# Patient Record
Sex: Female | Born: 1990 | Race: White | Hispanic: No | Marital: Single | Smoking: Current every day smoker
Health system: Southern US, Community
[De-identification: ages and names within clinical notes are randomized; demographics above are authoritative.]

## PROBLEM LIST (undated history)

## (undated) ENCOUNTER — Inpatient Hospital Stay (HOSPITAL_COMMUNITY): Payer: Self-pay

## (undated) DIAGNOSIS — Z789 Other specified health status: Secondary | ICD-10-CM

## (undated) DIAGNOSIS — F32A Depression, unspecified: Secondary | ICD-10-CM

## (undated) DIAGNOSIS — F909 Attention-deficit hyperactivity disorder, unspecified type: Secondary | ICD-10-CM

## (undated) HISTORY — DX: Depression, unspecified: F32.A

## (undated) HISTORY — DX: Attention-deficit hyperactivity disorder, unspecified type: F90.9

## (undated) HISTORY — DX: Other specified health status: Z78.9

## (undated) HISTORY — PX: NO PAST SURGERIES: SHX2092

---

## 2016-12-13 ENCOUNTER — Encounter: Payer: Self-pay | Admitting: Family Medicine

## 2016-12-13 ENCOUNTER — Ambulatory Visit (INDEPENDENT_AMBULATORY_CARE_PROVIDER_SITE_OTHER): Payer: Self-pay

## 2016-12-13 DIAGNOSIS — Z3201 Encounter for pregnancy test, result positive: Secondary | ICD-10-CM

## 2016-12-13 LAB — POCT PREGNANCY, URINE: PREG TEST UR: POSITIVE — AB

## 2016-12-13 NOTE — Progress Notes (Signed)
Patient presented to the office for a UPT. UPT positive. Patient is 5w 5d. Advised patient to start prenatal vitamins and prenatal care.

## 2016-12-18 ENCOUNTER — Encounter (HOSPITAL_COMMUNITY): Payer: Self-pay | Admitting: *Deleted

## 2016-12-18 ENCOUNTER — Inpatient Hospital Stay (HOSPITAL_COMMUNITY): Payer: Self-pay

## 2016-12-18 ENCOUNTER — Inpatient Hospital Stay (HOSPITAL_COMMUNITY)
Admission: AD | Admit: 2016-12-18 | Discharge: 2016-12-18 | Disposition: A | Discharge status: Home / Self Care | Payer: Self-pay | Source: Ambulatory Visit | Attending: Family Medicine | Admitting: Family Medicine

## 2016-12-18 DIAGNOSIS — N760 Acute vaginitis: Secondary | ICD-10-CM | POA: Insufficient documentation

## 2016-12-18 DIAGNOSIS — B9689 Other specified bacterial agents as the cause of diseases classified elsewhere: Secondary | ICD-10-CM | POA: Insufficient documentation

## 2016-12-18 DIAGNOSIS — Z3A01 Less than 8 weeks gestation of pregnancy: Secondary | ICD-10-CM | POA: Insufficient documentation

## 2016-12-18 DIAGNOSIS — O209 Hemorrhage in early pregnancy, unspecified: Secondary | ICD-10-CM

## 2016-12-18 DIAGNOSIS — O200 Threatened abortion: Secondary | ICD-10-CM | POA: Insufficient documentation

## 2016-12-18 LAB — URINALYSIS, ROUTINE W REFLEX MICROSCOPIC
Bilirubin Urine: NEGATIVE
Glucose, UA: NEGATIVE mg/dL
Hgb urine dipstick: NEGATIVE
KETONES UR: NEGATIVE mg/dL
LEUKOCYTES UA: NEGATIVE
NITRITE: NEGATIVE
Protein, ur: NEGATIVE mg/dL
Specific Gravity, Urine: 1.021 (ref 1.005–1.030)
pH: 5 (ref 5.0–8.0)

## 2016-12-18 LAB — WET PREP, GENITAL
Sperm: NONE SEEN
Trich, Wet Prep: NONE SEEN
Yeast Wet Prep HPF POC: NONE SEEN

## 2016-12-18 LAB — HCG, QUANTITATIVE, PREGNANCY: HCG, BETA CHAIN, QUANT, S: 45800 m[IU]/mL — AB (ref <5)

## 2016-12-18 LAB — ABO/RH: ABO/RH(D): O POS

## 2016-12-18 NOTE — MAU Provider Note (Signed)
Chief Complaint: Vaginal Bleeding   SUBJECTIVE HPI: Rebecca Weber is a 26 y.o. G2P0010 at [redacted]w[redacted]d who presents to MAU for vaginal bleeding. Patient states that bleeding first occurred Thursday after working at her job. 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 Bleeding started after that. She describes the bleeding as light pink and scant. Has continued to have the slight bleeding since then. She is requesting a note for light duties at work. She has not had initial prenatal care yet. She had a urine pregnancy test that was confirmed positive for pregnancy. She is dated by LMP. She denies any abdominal pain, vaginal discharge, fever, dysuria.   No past medical history on file. OB History  No data available   No past surgical history on file. Social History   Social History  . Marital status: Single    Spouse name: N/A  . Number of children: N/A  . Years of education: N/A   Occupational History  . Not on file.   Social History Main Topics  . Smoking status: Not on file  . Smokeless tobacco: Not on file  . Alcohol use Not on file  . Drug use: Unknown  . Sexual activity: Not on file   Other Topics Concern  . Not on file   Social History Narrative  . No narrative on file   No current facility-administered medications on file prior to encounter.    No current outpatient prescriptions on file prior to encounter.   Allergies  Allergen Reactions  . Sulfa Antibiotics Rash    I have reviewed the past Medical Hx, Surgical Hx, Social Hx, Allergies and Medications.   REVIEW OF SYSTEMS All systems reviewed and are negative for acute change except as noted in the HPI.   OBJECTIVE BP (!) 106/59   Pulse 65   Temp 98.4 F (36.9 C) (Oral)   Ht  (1.676 m)   Wt 79.4 kg (175 lb)   SpO2 100%   Breastfeeding? Unknown   BMI 28.25 kg/m   PHYSICAL EXAM Constitutional: Well-developed, well-nourished female in no acute distress.  Cardiovascular: normal rate  and rhythm, pulses intact Respiratory: normal rate and effort.  GI: Abd soft, non-tender, non-distended. No guarding or rebound tenderness.  MS: Extremities nontender, no edema, normal ROM Neurologic: Alert and oriented x 4. No focal deficits SPECULUM EXAM: NEFG, physiologic discharge, faint pink blood noted, cervix clean and closed BIMANUAL: No adnexal tenderness or masses. No CMT.  LAB RESULTS Results for orders placed or performed during the hospital encounter of 12/18/16 (from the past 24 hour(s))  Urinalysis, Routine w reflex microscopic     Status: Abnormal   Collection Time: 12/18/16  9:30 AM  Result Value Ref Range   Color, Urine YELLOW YELLOW   APPearance HAZY (A) CLEAR   Specific Gravity, Urine 1.021 1.005 - 1.030   pH 5.0 5.0 - 8.0   Glucose, UA NEGATIVE NEGATIVE mg/dL   Hgb urine dipstick NEGATIVE NEGATIVE   Bilirubin Urine NEGATIVE NEGATIVE   Ketones, ur NEGATIVE NEGATIVE mg/dL   Protein, ur NEGATIVE NEGATIVE mg/dL   Nitrite NEGATIVE NEGATIVE   Leukocytes, UA NEGATIVE NEGATIVE  Wet prep, genital     Status: Abnormal   Collection Time: 12/18/16 10:00 AM  Result Value Ref Range   Yeast Wet Prep HPF POC NONE SEEN NONE SEEN   Trich, Wet Prep NONE SEEN NONE SEEN   Clue Cells Wet Prep HPF POC PRESENT (A) NONE SEEN  WBC, Wet Prep HPF POC FEW (A) NONE SEEN   Sperm NONE SEEN   hCG, quantitative, pregnancy     Status: Abnormal   Collection Time: 12/18/16 10:16 AM  Result Value Ref Range   hCG, Beta Chain, Quant, S 45,800 (H) <5 mIU/mL  ABO/Rh     Status: None   Collection Time: 12/18/16 10:16 AM  Result Value Ref Range   ABO/RH(D) O POS     IMAGING [place] Ob Comp Less 14 Wks  Result Date: 12/18/2016 CLINICAL DATA:  26 year old pregnant female presents with 2 days of vaginal spotting. EDC by LMP: 08/10/2017, projecting to an expected gestational age of [redacted] weeks 3 days. EXAM: OBSTETRIC <14 WK US AND TRANSVAGINAL OB US TECHNIQUE: Both transabdominal and transvaginal  ultrasound examinations were performed for complete evaluation of the gestation as well as the maternal uterus, adnexal regions, and pelvic cul-de-sac. Transvaginal technique was performed to assess early pregnancy. COMPARISON:  None. FINDINGS: Intrauterine gestational sac: Single intrauterine gestational sac appears normal in size, shape and position. Yolk sac:  Visualized. Embryo:  Visualized. Embryonic Cardiac Activity: Visualized. Embryonic Heart Rate: 115  bpm CRL:  6.3  mm   6 w   3 d                  US EDC: 08/10/2017 Subchorionic hemorrhage:  None visualized. Maternal uterus/adnexae: Anteverted uterus. No uterine fibroids. No abnormal free fluid in the pelvis. Right ovary measures 3.4 x 1.7 x 1.8 cm and contains a corpus luteum. Left ovary measures 2.9 x 1.8 x 1.7 cm. No abnormal ovarian or adnexal masses. IMPRESSION: 1. Single living intrauterine gestation at 6 weeks 3 days by crown-rump length, concordant with provided menstrual dating. Embryonic heart rate 115 bpm, probably normal for this early gestational age. No acute early first-trimester gestational abnormality. 2. No ovarian or adnexal abnormality. Electronically Signed   By: Delbert PhenixJason A Poff M.D.   On: 12/18/2016 11:15   [place] Ob Transvaginal  Result Date: 12/18/2016 CLINICAL DATA:  26 year old pregnant female presents with 2 days of vaginal spotting. EDC by LMP: 08/10/2017, projecting to an expected gestational age of [redacted] weeks 3 days. EXAM: OBSTETRIC <14 WK US AND TRANSVAGINAL OB US TECHNIQUE: Both transabdominal and transvaginal ultrasound examinations were performed for complete evaluation of the gestation as well as the maternal uterus, adnexal regions, and pelvic cul-de-sac. Transvaginal technique was performed to assess early pregnancy. COMPARISON:  None. FINDINGS: Intrauterine gestational sac: Single intrauterine gestational sac appears normal in size, shape and position. Yolk sac:  Visualized. Embryo:  Visualized. Embryonic Cardiac Activity:  Visualized. Embryonic Heart Rate: 115  bpm CRL:  6.3  mm   6 w   3 d                  US EDC: 08/10/2017 Subchorionic hemorrhage:  None visualized. Maternal uterus/adnexae: Anteverted uterus. No uterine fibroids. No abnormal free fluid in the pelvis. Right ovary measures 3.4 x 1.7 x 1.8 cm and contains a corpus luteum. Left ovary measures 2.9 x 1.8 x 1.7 cm. No abnormal ovarian or adnexal masses. IMPRESSION: 1. Single living intrauterine gestation at 6 weeks 3 days by crown-rump length, concordant with provided menstrual dating. Embryonic heart rate 115 bpm, probably normal for this early gestational age. No acute early first-trimester gestational abnormality. 2. No ovarian or adnexal abnormality. Electronically Signed   By: Delbert PhenixJason A Poff M.D.   On: 12/18/2016 11:15    MAU COURSE Vitals and nursing notes reviewed I have ordered  labs/imaging and reviewed them bHCG collected and above discriminatory zone TV US showed viable intrauterine pregnancy Does have BV on wet prep but patient asymptomatic Cultures collected and pending  MDM Plan of care reviewed with patient, including labs and tests ordered and medical treatment.   ASSESSMENT 1. Vaginal bleeding in pregnancy, first trimester   2. Threatened miscarriage   3. Bacterial vaginosis     PLAN Discharge home in stable condition Miscarriage and bleeding precautions given Will hold off on treating BV since not symptomatic and first trimester Follow-up with OB provider Letter for work given Handout given   Caryl Ada, DO [facility], [facility] 12/18/2016, 9:48 AM

## 2016-12-18 NOTE — Discharge Instructions (Signed)
Vaginal Bleeding During Pregnancy, First Trimester °A small amount of bleeding (spotting) from the vagina is common in early pregnancy. Sometimes the bleeding is normal and is not a problem, and sometimes it is a sign of something serious. Be sure to tell your doctor about any bleeding from your vagina right away. °Follow these instructions at home: °· Watch your condition for any changes. °· Follow your doctor's instructions about how active you can be. °· If you are on bed rest: °? You may need to stay in bed and only get up to use the bathroom. °? You may be allowed to do some activities. °? If you need help, make plans for someone to help you. °· Write down: °? The number of pads you use each day. °? How often you change pads. °? How soaked (saturated) your pads are. °· Do not use tampons. °· Do not douche. °· Do not have sex or orgasms until your doctor says it is okay. °· If you pass any tissue from your vagina, save the tissue so you can show it to your doctor. °· Only take medicines as told by your doctor. °· Do not take aspirin because it can make you bleed. °· Keep all follow-up visits as told by your doctor. °Contact a doctor if: °· You bleed from your vagina. °· You have cramps. °· You have labor pains. °· You have a fever that does not go away after you take medicine. °Get help right away if: °· You have very bad cramps in your back or belly (abdomen). °· You pass large clots or tissue from your vagina. °· You bleed more. °· You feel light-headed or weak. °· You pass out (faint). °· You have chills. °· You are leaking fluid or have a gush of fluid from your vagina. °· You pass out while pooping (having a bowel movement). °This information is not intended to replace advice given to you by your health care provider. Make sure you discuss any questions you have with your health care provider. °Document Released: 08/10/2013 Document Revised: 09/01/2015 Document Reviewed: 12/01/2012 °Elsevier Interactive  Patient Education © 2018 [employer] Inc. ° °

## 2016-12-18 NOTE — MAU Note (Signed)
Pt. was at work on Sunday, pt. Informed employer that she could not do any heavy lifting, but due to no documentation from doctor...they would not honor patient's request.  Pt. Started vaginal bleeding shortly after lifting heavy items.  Today, pain 1/10..."stomach ache". Small amount of bleeding.

## 2016-12-19 LAB — GC/CHLAMYDIA PROBE AMP (~~LOC~~) NOT AT ARMC
Chlamydia: NEGATIVE
NEISSERIA GONORRHEA: NEGATIVE

## 2017-02-01 ENCOUNTER — Ambulatory Visit (INDEPENDENT_AMBULATORY_CARE_PROVIDER_SITE_OTHER): Payer: Self-pay | Admitting: Family Medicine

## 2017-02-01 ENCOUNTER — Encounter: Payer: Self-pay | Admitting: Family Medicine

## 2017-02-01 DIAGNOSIS — Z124 Encounter for screening for malignant neoplasm of cervix: Secondary | ICD-10-CM

## 2017-02-01 DIAGNOSIS — Z3490 Encounter for supervision of normal pregnancy, unspecified, unspecified trimester: Secondary | ICD-10-CM | POA: Insufficient documentation

## 2017-02-01 DIAGNOSIS — O99330 Smoking (tobacco) complicating pregnancy, unspecified trimester: Secondary | ICD-10-CM | POA: Insufficient documentation

## 2017-02-01 DIAGNOSIS — Z3491 Encounter for supervision of normal pregnancy, unspecified, first trimester: Secondary | ICD-10-CM

## 2017-02-01 DIAGNOSIS — Z3481 Encounter for supervision of other normal pregnancy, first trimester: Secondary | ICD-10-CM

## 2017-02-01 DIAGNOSIS — Z23 Encounter for immunization: Secondary | ICD-10-CM

## 2017-02-01 DIAGNOSIS — O99331 Smoking (tobacco) complicating pregnancy, first trimester: Secondary | ICD-10-CM

## 2017-02-01 LAB — POCT URINALYSIS DIP (DEVICE)
BILIRUBIN URINE: NEGATIVE
Glucose, UA: NEGATIVE mg/dL
HGB URINE DIPSTICK: NEGATIVE
Ketones, ur: NEGATIVE mg/dL
LEUKOCYTES UA: NEGATIVE
NITRITE: NEGATIVE
Protein, ur: NEGATIVE mg/dL
SPECIFIC GRAVITY, URINE: 1.015 (ref 1.005–1.030)
UROBILINOGEN UA: 0.2 mg/dL (ref 0.0–1.0)
pH: 7.5 (ref 5.0–8.0)

## 2017-02-01 NOTE — Patient Instructions (Signed)
Breastfeeding Deciding to breastfeed is one of the best choices you can make for you and your baby. A change in hormones during pregnancy causes your breast tissue to grow and increases the number and size of your milk ducts. These hormones also allow proteins, sugars, and fats from your blood supply to make breast milk in your milk-producing glands. Hormones prevent breast milk from being released before your baby is born as well as prompt milk flow after birth. Once breastfeeding has begun, thoughts of your baby, as well as his or her sucking or crying, can stimulate the release of milk from your milk-producing glands. Benefits of breastfeeding For Your Baby  Your first milk (colostrum) helps your baby's digestive system function better.  There are antibodies in your milk that help your baby fight off infections.  Your baby has a lower incidence of asthma, allergies, and sudden infant death syndrome.  The nutrients in breast milk are better for your baby than infant formulas and are designed uniquely for your baby's needs.  Breast milk improves your baby's brain development.  Your baby is less likely to develop other conditions, such as childhood obesity, asthma, or type 2 diabetes mellitus.  For You  Breastfeeding helps to create a very special bond between you and your baby.  Breastfeeding is convenient. Breast milk is always available at the correct temperature and costs nothing.  Breastfeeding helps to burn calories and helps you lose the weight gained during pregnancy.  Breastfeeding makes your uterus contract to its prepregnancy size faster and slows bleeding (lochia) after you give birth.  Breastfeeding helps to lower your risk of developing type 2 diabetes mellitus, osteoporosis, and breast or ovarian cancer later in life.  Signs that your baby is hungry Early Signs of Hunger  Increased alertness or activity.  Stretching.  Movement of the head from side to  side.  Movement of the head and opening of the mouth when the corner of the mouth or cheek is stroked (rooting).  Increased sucking sounds, smacking lips, cooing, sighing, or squeaking.  Hand-to-mouth movements.  Increased sucking of fingers or hands.  Late Signs of Hunger  Fussing.  Intermittent crying.  Extreme Signs of Hunger Signs of extreme hunger will require calming and consoling before your baby will be able to breastfeed successfully. Do not wait for the following signs of extreme hunger to occur before you initiate breastfeeding:  Restlessness.  A loud, strong cry.  Screaming.  Breastfeeding basics Breastfeeding Initiation  Find a comfortable place to sit or lie down, with your neck and back well supported.  Place a pillow or rolled up blanket under your baby to bring him or her to the level of your breast (if you are seated). Nursing pillows are specially designed to help support your arms and your baby while you breastfeed.  Make sure that your baby's abdomen is facing your abdomen.  Gently massage your breast. With your fingertips, massage from your chest wall toward your nipple in a circular motion. This encourages milk flow. You may need to continue this action during the feeding if your milk flows slowly.  Support your breast with 4 fingers underneath and your thumb above your nipple. Make sure your fingers are well away from your nipple and your baby's mouth.  Stroke your baby's lips gently with your finger or nipple.  When your baby's mouth is open wide enough, quickly bring your baby to your breast, placing your entire nipple and as much of the colored area   around your nipple (areola) as possible into your baby's mouth. ? More areola should be visible above your baby's upper lip than below the lower lip. ? Your baby's tongue should be between his or her lower gum and your breast.  Ensure that your baby's mouth is correctly positioned around your nipple  (latched). Your baby's lips should create a seal on your breast and be turned out (everted).  It is common for your baby to suck about 2-3 minutes in order to start the flow of breast milk.  Latching Teaching your baby how to latch on to your breast properly is very important. An improper latch can cause nipple pain and decreased milk supply for you and poor weight gain in your baby. Also, if your baby is not latched onto your nipple properly, he or she may swallow some air during feeding. This can make your baby fussy. Burping your baby when you switch breasts during the feeding can help to get rid of the air. However, teaching your baby to latch on properly is still the best way to prevent fussiness from swallowing air while breastfeeding. Signs that your baby has successfully latched on to your nipple:  Silent tugging or silent sucking, without causing you pain.  Swallowing heard between every 3-4 sucks.  Muscle movement above and in front of his or her ears while sucking.  Signs that your baby has not successfully latched on to nipple:  Sucking sounds or smacking sounds from your baby while breastfeeding.  Nipple pain.  If you think your baby has not latched on correctly, slip your finger into the corner of your baby's mouth to break the suction and place it between your baby's gums. Attempt breastfeeding initiation again. Signs of Successful Breastfeeding Signs from your baby:  A gradual decrease in the number of sucks or complete cessation of sucking.  Falling asleep.  Relaxation of his or her body.  Retention of a small amount of milk in his or her mouth.  Letting go of your breast by himself or herself.  Signs from you:  Breasts that have increased in firmness, weight, and size 1-3 hours after feeding.  Breasts that are softer immediately after breastfeeding.  Increased milk volume, as well as a change in milk consistency and color by the fifth day of  breastfeeding.  Nipples that are not sore, cracked, or bleeding.  Signs That Your Baby is Getting Enough Milk  Wetting at least 1-2 diapers during the first 24 hours after birth.  Wetting at least 5-6 diapers every 24 hours for the first week after birth. The urine should be clear or pale yellow by 5 days after birth.  Wetting 6-8 diapers every 24 hours as your baby continues to grow and develop.  At least 3 stools in a 24-hour period by age 5 days. The stool should be soft and yellow.  At least 3 stools in a 24-hour period by age 7 days. The stool should be seedy and yellow.  No loss of weight greater than 10% of birth weight during the first 3 days of age.  Average weight gain of 4-7 ounces (113-198 g) per week after age 4 days.  Consistent daily weight gain by age 5 days, without weight loss after the age of 2 weeks.  After a feeding, your baby may spit up a small amount. This is common. Breastfeeding frequency and duration Frequent feeding will help you make more milk and can prevent sore nipples and breast engorgement. Breastfeed when   you feel the need to reduce the fullness of your breasts or when your baby shows signs of hunger. This is called "breastfeeding on demand." Avoid introducing a pacifier to your baby while you are working to establish breastfeeding (the first 4-6 weeks after your baby is born). After this time you may choose to use a pacifier. Research has shown that pacifier use during the first year of a baby's life decreases the risk of sudden infant death syndrome (SIDS). Allow your baby to feed on each breast as long as he or she wants. Breastfeed until your baby is finished feeding. When your baby unlatches or falls asleep while feeding from the first breast, offer the second breast. Because newborns are often sleepy in the first few weeks of life, you may need to awaken your baby to get him or her to feed. Breastfeeding times will vary from baby to baby. However,  the following rules can serve as a guide to help you ensure that your baby is properly fed:  Newborns (babies 4 weeks of age or younger) may breastfeed every 1-3 hours.  Newborns should not go longer than 3 hours during the day or 5 hours during the night without breastfeeding.  You should breastfeed your baby a minimum of 8 times in a 24-hour period until you begin to introduce solid foods to your baby at around 6 months of age.  Breast milk pumping Pumping and storing breast milk allows you to ensure that your baby is exclusively fed your breast milk, even at times when you are unable to breastfeed. This is especially important if you are going back to work while you are still breastfeeding or when you are not able to be present during feedings. Your lactation consultant can give you guidelines on how long it is safe to store breast milk. A breast pump is a machine that allows you to pump milk from your breast into a sterile bottle. The pumped breast milk can then be stored in a refrigerator or freezer. Some breast pumps are operated by hand, while others use electricity. Ask your lactation consultant which type will work best for you. Breast pumps can be purchased, but some hospitals and breastfeeding support groups lease breast pumps on a monthly basis. A lactation consultant can teach you how to hand express breast milk, if you prefer not to use a pump. Caring for your breasts while you breastfeed Nipples can become dry, cracked, and sore while breastfeeding. The following recommendations can help keep your breasts moisturized and healthy:  Avoid using soap on your nipples.  Wear a supportive bra. Although not required, special nursing bras and tank tops are designed to allow access to your breasts for breastfeeding without taking off your entire bra or top. Avoid wearing underwire-style bras or extremely tight bras.  Air dry your nipples for 3-4minutes after each feeding.  Use only cotton  bra pads to absorb leaked breast milk. Leaking of breast milk between feedings is normal.  Use lanolin on your nipples after breastfeeding. Lanolin helps to maintain your skin's normal moisture barrier. If you use pure lanolin, you do not need to wash it off before feeding your baby again. Pure lanolin is not toxic to your baby. You may also hand express a few drops of breast milk and gently massage that milk into your nipples and allow the milk to air dry.  In the first few weeks after giving birth, some women experience extremely full breasts (engorgement). Engorgement can make your   breasts feel heavy, warm, and tender to the touch. Engorgement peaks within 3-5 days after you give birth. The following recommendations can help ease engorgement:  Completely empty your breasts while breastfeeding or pumping. You may want to start by applying warm, moist heat (in the shower or with warm water-soaked hand towels) just before feeding or pumping. This increases circulation and helps the milk flow. If your baby does not completely empty your breasts while breastfeeding, pump any extra milk after he or she is finished.  Wear a snug bra (nursing or regular) or tank top for 1-2 days to signal your body to slightly decrease milk production.  Apply ice packs to your breasts, unless this is too uncomfortable for you.  Make sure that your baby is latched on and positioned properly while breastfeeding.  If engorgement persists after 48 hours of following these recommendations, contact your health care provider or a lactation consultant. Overall health care recommendations while breastfeeding  Eat healthy foods. Alternate between meals and snacks, eating 3 of each per day. Because what you eat affects your breast milk, some of the foods may make your baby more irritable than usual. Avoid eating these foods if you are sure that they are negatively affecting your baby.  Drink milk, fruit juice, and water to  satisfy your thirst (about 10 glasses a day).  Rest often, relax, and continue to take your prenatal vitamins to prevent fatigue, stress, and anemia.  Continue breast self-awareness checks.  Avoid chewing and smoking tobacco. Chemicals from cigarettes that pass into breast milk and exposure to secondhand smoke may harm your baby.  Avoid alcohol and drug use, including marijuana. Some medicines that may be harmful to your baby can pass through breast milk. It is important to ask your health care provider before taking any medicine, including all over-the-counter and prescription medicine as well as vitamin and herbal supplements. It is possible to become pregnant while breastfeeding. If birth control is desired, ask your health care provider about options that will be safe for your baby. Contact a health care provider if:  You feel like you want to stop breastfeeding or have become frustrated with breastfeeding.  You have painful breasts or nipples.  Your nipples are cracked or bleeding.  Your breasts are red, tender, or warm.  You have a swollen area on either breast.  You have a fever or chills.  You have nausea or vomiting.  You have drainage other than breast milk from your nipples.  Your breasts do not become full before feedings by the fifth day after you give birth.  You feel sad and depressed.  Your baby is too sleepy to eat well.  Your baby is having trouble sleeping.  Your baby is wetting less than 3 diapers in a 24-hour period.  Your baby has less than 3 stools in a 24-hour period.  Your baby's skin or the white part of his or her eyes becomes yellow.  Your baby is not gaining weight by 5 days of age. Get help right away if:  Your baby is overly tired (lethargic) and does not want to wake up and feed.  Your baby develops an unexplained fever. This information is not intended to replace advice given to you by your health care provider. Make sure you discuss  any questions you have with your health care provider. Document Released: 03/26/2005 Document Revised: 09/07/2015 Document Reviewed: 09/17/2012 Elsevier Interactive Patient Education  2017 [employer]  

## 2017-02-01 NOTE — Progress Notes (Signed)
    Subjective:    Rebecca Weber is a G2P0010 8154w4d being seen today for her first obstetrical visit.  Her obstetrical history is significant for smoker. Patient does intend to breast feed. Pregnancy history fully reviewed.  Patient reports no complaints.  Vitals:   02/01/17 1023  BP: 113/67  Pulse: 64  Weight: 160 lb (72.6 kg)    HISTORY: OB History  Gravida Para Term Preterm AB Living  2 0 0 0 1 0  SAB TAB Ectopic Multiple Live Births  0 1 0 0 0    # Outcome Date GA Lbr Len/2nd Weight Sex Delivery Anes PTL Lv  2 Current           1 TAB 10/2012             Past Medical History:  Diagnosis Date  . Medical history non-contributory    Past Surgical History:  Procedure Laterality Date  . NO PAST SURGERIES     Family History  Problem Relation Age of Onset  . Diabetes Mother      Exam    Uterus:   12 wk size  Pelvic Exam:    Perineum: Normal Perineum   Vulva: normal   Vagina:  normal mucosa, normal discharge   Cervix: no lesions and nulliparous appearance   Adnexa: normal adnexa   Bony Pelvis: average  System: Breast:  normal appearance, no masses or tenderness   Skin: normal coloration and turgor, no rashes   Neurologic: oriented   Extremities: no erythema, induration, or nodules   HEENT extra ocular movement intact and sclera clear, anicteric   Mouth/Teeth mucous membranes moist, pharynx normal without lesions   Neck supple and no masses   Cardiovascular: regular rate and rhythm, no murmurs or gallops   Respiratory:  appears well, vitals normal, no respiratory distress, acyanotic, normal RR, ear and throat exam is normal, neck free of mass or lymphadenopathy, chest clear, no wheezing, crepitations, rhonchi, normal symmetric air entry   Abdomen: soft, non-tender; bowel sounds normal; no masses,  no organomegaly      Assessment/Plan:    Pregnancy: G2P0010 1. Encounter for supervision of low-risk pregnancy in first trimester New OB labs History and meds  reviewed Babyscripts optimization done - Obstetric Panel, Including HIV - Culture, OB Urine - Cystic fibrosis gene test - Cytology - PAP - Flu Vaccine QUAD 36+ mos IM (Fluarix, Quad PF) - US MFM Fetal Nuchal Translucency; Future - Babyscripts Schedule Optimization  2. Tobacco smoking affecting pregnancy in first trimester Anatomy u/Rebecca scheduled - US MFM OB DETAIL +14 WK; Future - HgB A1c   Rebecca Weber Rebecca Weber 02/01/2017

## 2017-02-03 LAB — OBSTETRIC PANEL, INCLUDING HIV
ANTIBODY SCREEN: NEGATIVE
BASOS ABS: 0 10*3/uL (ref 0.0–0.2)
BASOS: 0 %
EOS (ABSOLUTE): 0.3 10*3/uL (ref 0.0–0.4)
Eos: 4 %
HEMATOCRIT: 38 % (ref 34.0–46.6)
HIV SCREEN 4TH GENERATION: NONREACTIVE
Hemoglobin: 12.6 g/dL (ref 11.1–15.9)
Hepatitis B Surface Ag: NEGATIVE
IMMATURE GRANS (ABS): 0 10*3/uL (ref 0.0–0.1)
Immature Granulocytes: 0 %
LYMPHS: 23 %
Lymphocytes Absolute: 1.8 10*3/uL (ref 0.7–3.1)
MCH: 32.4 pg (ref 26.6–33.0)
MCHC: 33.2 g/dL (ref 31.5–35.7)
MCV: 98 fL — ABNORMAL HIGH (ref 79–97)
MONOS ABS: 0.4 10*3/uL (ref 0.1–0.9)
Monocytes: 5 %
Neutrophils Absolute: 5.2 10*3/uL (ref 1.4–7.0)
Neutrophils: 68 %
PLATELETS: 225 10*3/uL (ref 150–379)
RBC: 3.89 x10E6/uL (ref 3.77–5.28)
RDW: 13.3 % (ref 12.3–15.4)
RPR Ser Ql: NONREACTIVE
Rh Factor: POSITIVE
Rubella Antibodies, IGG: 1.12 index (ref >0.99)
WBC: 7.6 10*3/uL (ref 3.4–10.8)

## 2017-02-04 ENCOUNTER — Ambulatory Visit (HOSPITAL_COMMUNITY): Admission: RE | Admit: 2017-02-04 | Payer: Self-pay | Source: Ambulatory Visit

## 2017-02-04 ENCOUNTER — Other Ambulatory Visit: Payer: Self-pay | Admitting: Family Medicine

## 2017-02-04 ENCOUNTER — Encounter (HOSPITAL_COMMUNITY): Payer: Self-pay

## 2017-02-04 ENCOUNTER — Ambulatory Visit (HOSPITAL_COMMUNITY)
Admission: RE | Admit: 2017-02-04 | Discharge: 2017-02-04 | Disposition: A | Discharge status: Home / Self Care | Payer: Self-pay | Source: Ambulatory Visit | Attending: Family Medicine | Admitting: Family Medicine

## 2017-02-04 DIAGNOSIS — O99331 Smoking (tobacco) complicating pregnancy, first trimester: Secondary | ICD-10-CM | POA: Insufficient documentation

## 2017-02-04 DIAGNOSIS — Z3682 Encounter for antenatal screening for nuchal translucency: Secondary | ICD-10-CM | POA: Insufficient documentation

## 2017-02-04 DIAGNOSIS — Z3A13 13 weeks gestation of pregnancy: Secondary | ICD-10-CM | POA: Insufficient documentation

## 2017-02-04 DIAGNOSIS — Z3491 Encounter for supervision of normal pregnancy, unspecified, first trimester: Secondary | ICD-10-CM

## 2017-02-04 LAB — CYTOLOGY - PAP
Adequacy: ABSENT
DIAGNOSIS: NEGATIVE

## 2017-02-06 LAB — URINE CULTURE, OB REFLEX

## 2017-02-06 LAB — CULTURE, OB URINE

## 2017-02-08 LAB — CYSTIC FIBROSIS GENE TEST

## 2017-02-11 ENCOUNTER — Encounter: Payer: Self-pay | Admitting: *Deleted

## 2017-03-19 ENCOUNTER — Encounter: Payer: Self-pay | Admitting: Advanced Practice Midwife

## 2017-03-19 ENCOUNTER — Encounter: Payer: Self-pay | Admitting: General Practice

## 2017-03-19 ENCOUNTER — Other Ambulatory Visit: Payer: Self-pay | Admitting: Family Medicine

## 2017-03-19 ENCOUNTER — Ambulatory Visit (HOSPITAL_COMMUNITY)
Admission: RE | Admit: 2017-03-19 | Discharge: 2017-03-19 | Disposition: A | Discharge status: Home / Self Care | Payer: Medicaid Other | Source: Ambulatory Visit | Attending: Family Medicine | Admitting: Family Medicine

## 2017-03-19 ENCOUNTER — Ambulatory Visit (INDEPENDENT_AMBULATORY_CARE_PROVIDER_SITE_OTHER): Payer: Self-pay | Admitting: Advanced Practice Midwife

## 2017-03-19 ENCOUNTER — Other Ambulatory Visit: Payer: Self-pay | Admitting: Certified Nurse Midwife

## 2017-03-19 VITALS — BP 135/72 | HR 102 | Wt 170.1 lb

## 2017-03-19 DIAGNOSIS — O99332 Smoking (tobacco) complicating pregnancy, second trimester: Secondary | ICD-10-CM | POA: Diagnosis not present

## 2017-03-19 DIAGNOSIS — Z1371 Encounter for nonprocreative screening for genetic disease carrier status: Secondary | ICD-10-CM

## 2017-03-19 DIAGNOSIS — Z3689 Encounter for other specified antenatal screening: Secondary | ICD-10-CM | POA: Insufficient documentation

## 2017-03-19 DIAGNOSIS — Z3A19 19 weeks gestation of pregnancy: Secondary | ICD-10-CM | POA: Diagnosis not present

## 2017-03-19 DIAGNOSIS — Z3492 Encounter for supervision of normal pregnancy, unspecified, second trimester: Secondary | ICD-10-CM

## 2017-03-19 DIAGNOSIS — O99331 Smoking (tobacco) complicating pregnancy, first trimester: Secondary | ICD-10-CM

## 2017-03-19 NOTE — Progress Notes (Signed)
Pt wants quad screen

## 2017-03-19 NOTE — Progress Notes (Signed)
   PRENATAL VISIT NOTE  Subjective:  Rebecca Weber is a 26 y.o. G2P0010 at 212w1d being seen today for ongoing prenatal care.  She is currently monitored for the following issues for this low-risk pregnancy and has Supervision of low-risk pregnancy and Tobacco smoking affecting pregnancy on their problem list.  Patient reports no complaints.  Contractions: Not present. Vag. Bleeding: None.  Movement: Present. Denies leaking of fluid.   The following portions of the patient's history were reviewed and updated as appropriate: allergies, current medications, past family history, past medical history, past social history, past surgical history and problem list. Problem list updated.  Objective:   Vitals:   03/19/17 1022  BP: 135/72  Pulse: (!) 102  Weight: 170 lb 1.6 oz (77.2 kg)    Fetal Status: Fetal Heart Rate (bpm): 154   Movement: Present     General:  Alert, oriented and cooperative. Patient is in no acute distress.  Skin: Skin is warm and dry. No rash noted.   Cardiovascular: Normal heart rate noted  Respiratory: Normal respiratory effort, no problems with respiration noted  Abdomen: Soft, gravid, appropriate for gestational age.  Pain/Pressure: Absent     Pelvic: Cervical exam deferred        Extremities: Normal range of motion.  Edema: None  Mental Status:  Normal mood and affect. Normal behavior. Normal judgment and thought content.   Assessment and Plan:  Pregnancy: G2P0010 at 642w1d  1. Encounter for supervision of low-risk pregnancy in second trimester -Discussed use of BabyScripts app as scheduled and inputing BP weekly or need to be seen in office more recently.   2. Tobacco smoking affecting pregnancy in second trimester -Anatomy US scheduled for today   3. Screening for genetic disease carrier status - AFP TETRA  Preterm labor symptoms and general obstetric precautions including but not limited to vaginal bleeding, contractions, leaking of fluid and fetal movement  were reviewed in detail with the patient. Please refer to After Visit Summary for other counseling recommendations.  Return in about 8 weeks (around 05/14/2017) for ROB.   Sharyon CableVeronica C Reshma Hoey, CNM

## 2017-03-19 NOTE — Patient Instructions (Signed)
Childbirth Education Options: [facility] Classes:  Childbirth education classes can help you get ready for a positive parenting experience. You can also meet other expectant parents and get free stuff for your baby. Each class runs for five weeks on the same night and costs $45 for the mother-to-be and her support person. Medicaid covers the cost if you are eligible. Call [phone] to register. [facility] Childbirth Education:  [phone] or [phone] or [email or web address]  Baby & Me Class: Discuss newborn & infant parenting and family adjustment issues with other new mothers in a relaxed environment. Each week brings a new speaker or baby-centered activity. We encourage new mothers to join us every Thursday at 11:00am. Babies birth until crawling. No registration or fee. Daddy Boot Camp: This course offers Dads-to-be the tools and knowledge needed to feel confident on their journey to becoming new fathers. Experienced dads, who have been trained as coaches, teach dads-to-be how to hold, comfort, diaper, swaddle and play with their infant while being able to support the new mom as well. A class for men taught by men. $25/dad Big Brother/Big Sister: Let your children share in the joy of a new brother or sister in this special class designed just for them. Class includes discussion about how families care for babies: swaddling, holding, diapering, safety as well as how they can be helpful in their new role. This class is designed for children ages 2 to 6, but any age is welcome. Please register each child individually. $5/child  Mom Talk: This mom-led group offers support and connection to mothers as they journey through the adjustments and struggles of that sometimes overwhelming first year after the birth of a child. Tuesdays at 10:00am and Thursdays at 6:00pm. Babies welcome. No registration or fee. Breastfeeding Support Group: This group is a mother-to-mother  support circle where moms have the opportunity to share their breastfeeding experiences. A Lactation Consultant is present for questions and concerns. Meets each Tuesday at 11:00am. No fee or registration. Breastfeeding Your Baby: Learn what to expect in the first days of breastfeeding your newborn.  This class will help you feel more confident with the skills needed to begin your breastfeeding experience. Many new mothers are concerned about breastfeeding after leaving the hospital. This class will also address the most common fears and challenges about breastfeeding during the first few weeks, months and beyond. (call for fee) Comfort Techniques and Tour: This 2 hour interactive class will provide you the opportunity to learn & practice hands-on techniques that can help relieve some of the discomfort of labor and encourage your baby to rotate toward the best position for birth. You and your partner will be able to try a variety of labor positions with birth balls and rebozos as well as practice breathing, relaxation, and visualization techniques. A tour of the [facility] is included with this class. $20 per registrant and support person Childbirth Class- Weekend Option: This class is a Weekend version of our Birth & Baby series. It is designed for parents who have a difficult time fitting several weeks of classes into their schedule. It covers the care of your newborn and the basics of labor and childbirth. It also includes a Maternity Care Center Tour of [facility] and lunch. The class is held two consecutive days: beginning on Friday evening from 6:30 - 8:30 p.m. and the next day, Saturday from 9 a.m. - 4 p.m. (call for fee) Waterbirth Class: Interested in a waterbirth?  This   informational class will help you discover whether waterbirth is the right fit for you. Education about waterbirth itself, supplies you would need and how to assemble your support team is what you can  expect from this class. Some obstetrical practices require this class in order to pursue a waterbirth. (Not all obstetrical practices offer waterbirth-check with your healthcare provider.) Register only the expectant mom, but you are encouraged to bring your partner to class! Required if planning waterbirth, no fee. Infant/Child CPR: Parents, grandparents, babysitters, and friends learn Cardio-Pulmonary Resuscitation skills for infants and children. You will also learn how to treat both conscious and unconscious choking in infants and children. This Family & Friends program does not offer certification. Register each participant individually to ensure that enough mannequins are available. (Call for fee) Grandparent Love: Expecting a grandbaby? This class is for you! Learn about the latest infant care and safety recommendations and ways to support your own child as he or she transitions into the parenting role. Taught by Registered Nurses who are childbirth instructors, but most importantly...they are grandmothers too! $10/person. Childbirth Class- Natural Childbirth: This series of 5 weekly classes is for expectant parents who want to learn and practice natural methods of coping with the process of labor and childbirth. Relaxation, breathing, massage, visualization, role of the partner, and helpful positioning are highlighted. Participants learn how to be confident in their body's ability to give birth. This class will empower and help parents make informed decisions about their own care. Includes discussion that will help new parents transition into the immediate postpartum period. Maternity Care Center Tour of [facility] is included. We suggest taking this class between 25-32 weeks, but it's only a recommendation. $75 per registrant and one support person or $30 Medicaid. Childbirth Class- 3 week Series: This option of 3 weekly classes helps you and your labor partner prepare for childbirth. Newborn  care, labor & birth, cesarean birth, pain management, and comfort techniques are discussed and a Maternity Care Center Tour of [facility] is included. The class meets at the same time, on the same day of the week for 3 consecutive weeks beginning with the starting date you choose. $60 for registrant and one support person.  Marvelous Multiples: Expecting twins, triplets, or more? This class covers the differences in labor, birth, parenting, and breastfeeding issues that face multiples' parents. NICU tour is included. Led by a Certified Childbirth Educator who is the mother of twins. No fee. Caring for Baby: This class is for expectant and adoptive parents who want to learn and practice the most up-to-date newborn care for their babies. Focus is on birth through the first six weeks of life. Topics include feeding, bathing, diapering, crying, umbilical cord care, circumcision care and safe sleep. Parents learn to recognize symptoms of illness and when to call the pediatrician. Register only the mom-to-be and your partner or support person can plan to come with you! $10 per registrant and support person Childbirth Class- online option: This online class offers you the freedom to complete a Birth and Baby series in the comfort of your own home. The flexibility of this option allows you to review sections at your own pace, at times convenient to you and your support people. It includes additional video information, animations, quizzes, and extended activities. Get organized with helpful eClass tools, checklists, and trackers. Once you register online for the class, you will receive an email within a few days to accept the invitation and begin the class when the time   is right for you. The content will be available to you for 60 days. $60 for 60 days of online access for you and your support people.  Local Doulas: Natural Baby Doulas [email or web address] Tel:  [phone] [email or web address] [facility] [phone] [email or web address] The Labor Ladies  (also do waterbirth tub rental) [phone] [email or web address] [facility] [phone] [email or web address] Sacred Rhythms  [phone] [email or web address] [place] [employer] (PADA) [email or web address] http://www.padanc.org/index.htm [place] Birth and Baby  [email or web address] Considering Waterbirth? Guide for patients at [facility]  Why consider waterbirth?  . Gentle birth for babies . Less pain medicine used in labor . May allow for passive descent/less pushing . May reduce perineal tears  . More mobility and instinctive maternal position changes . Increased maternal relaxation . Reduced blood pressure in labor  Is waterbirth safe? What are the risks of infection, drowning or other complications?  . Infection: o Very low risk (3.7 % for tub vs 4.8% for bed) o 7 in 8000 waterbirths with documented infection o Poorly cleaned equipment most common cause o Slightly lower group B strep transmission rate  . Drowning o Maternal:  - Very low risk   - Related to seizures or fainting o Newborn:  - Very low risk. No evidence of increased risk of respiratory problems in multiple large studies - Physiological protection from breathing under water - Avoid underwater birth if there are any fetal complications - Once baby's head is out of the water, keep it out.  . Birth complication o Some reports of cord trauma, but risk decreased by bringing baby to surface gradually o No evidence of increased risk of shoulder dystocia. Mothers can usually change positions faster in water than in a bed, possibly aiding the maneuvers to free the shoulder.   You must attend a Waterbirth class at [facility]  3rd Wednesday of every month from 7-9pm  Free  Register by calling [phone] or online at [email or web address]  Bring us the certificate from the class to your prenatal appointment  Meet with a midwife at 36 weeks to see if you can still plan a waterbirth and to sign the consent.   Purchase or rent the following supplies:   Water Birth Pool (Birth Pool in a Box or [place] for instance)  (Tubs start ~$125)  Single-use disposable tub liner designed for your brand of tub  New garden hose labeled "lead-free", "suitable for drinking water",  Electric drain pump to remove water (We recommend 792 gallon per hour or greater pump.)   Separate garden hose to remove the dirty water  Fish net  Bathing suit top (optional)  Long-handled mirror (optional)  Places to purchase or rent supplies  Yourwaterbirth.com for tub purchases and supplies  Waterbirthsolutions.com for tub purchases and supplies  The Labor Ladies (www.[email or web address]) $275 for tub rental/set-up & take down/kit   [facility] [employer] (http://www.padanc.org/MeetUs.htm) Information regarding doulas (labor support) who provide pool rentals  Our practice has a Birth Pool in a Box tub at the hospital that you may borrow on a first-come-first-served basis. It is your responsibility to to set up, clean and break down the tub. We cannot guarantee the availability of this tub in advance. You are responsible for bringing all accessories listed above. If you do not have all necessary supplies you cannot have a waterbirth.    Things that would prevent you from having a waterbirth:  Premature, <37wks  Previous cesarean birth  Presence of thick meconium-stained fluid  Multiple gestation (Twins,   triplets, etc.)  Uncontrolled diabetes or gestational diabetes requiring medication  Hypertension requiring medication or diagnosis of pre-eclampsia  Heavy vaginal bleeding  Non-reassuring fetal  heart rate  Active infection (MRSA, etc.). Group B Strep is NOT a contraindication for  waterbirth.  If your labor has to be induced and induction method requires continuous  monitoring of the baby's heart rate  Other risks/issues identified by your obstetrical provider  Please remember that birth is unpredictable. Under certain unforeseeable circumstances your provider may advise against giving birth in the tub. These decisions will be made on a case-by-case basis and with the safety of you and your baby as our highest priority.   AREA PEDIATRIC/FAMILY PRACTICE PHYSICIANS  [facility] [address], [address] [place], [place]  [address] Phone - [phone]   Fax - [phone]  [facility] [address] [place], [place] [address] Phone - [phone]   Fax - [phone]  [facility] [address] [place], [place]  [address] Phone - [phone]   Fax - [phone]  [facility] [address] [place], [place]  [address] Phone - [phone]   Fax - [phone]  [facility] [address] [place], [place]  [address] Phone - [phone]   Fax - [phone]  [facility] [address] [place], [place]  [address] Phone - [phone]   Fax - [phone]  [facility] [address] [place], [place]  [address] Phone - [phone]   Fax - [phone]  [facility] [address] [place], [place]  [address] Phone - [phone]   Fax - [phone]  [facility] [address] [place], [place]  [address] Phone - [phone]   Fax - [phone] [facility] [address] [place], [place]  [address] Phone - [phone]   Fax - [phone]  [facility] [address] [place], [place]  [address] Phone -  [phone]   Fax - [phone]  [facility] [address] [place], [place]  [address] Phone - [phone]   Fax - [phone]  [facility] [address] [place], [place]  [address] Phone - [phone]   Fax - [phone]  [facility] [address] [place], [place]  [address] Phone - [phone]  [facility] [address] [place], [place]  [address] Phone - [phone]   Fax - [phone]  [facility] [address] [place], [place]  [address] Phone - [phone]   Fax - [phone]  [facility] [address] [place], [place]  [address] Phone - [phone]   Fax - [phone]  [facility] [address] [place], [place]  [address] Phone - [phone]   Fax - [phone]   [facility] [address] [place], [place]  [address] Phone - [phone]   Fax - [phone]  [facility] [address] [place],   [address] Phone - [phone]   Fax - [phone]  [facility] [address] ,   [address] Phone - [phone]   Fax - [phone]  [facility] [address] ,   [address] Phone - [phone]   Fax - [phone]  [facility] [address] [place], [place]  [address] Phone - [phone]   Fax - [phone] [facility] [address] [place], [place]  [address] Phone - [phone]   Fax - [phone]  [facility] [address] [place], [place]  [address] Phone - [phone]   Fax - [phone]  [facility] [address] [place], [place]  [address] Phone - [phone]   Fax - [phone]  [facility] Charlene Flemming MD [address] [place] [address] Phone [phone]  Fax [phone]     

## 2017-03-20 ENCOUNTER — Encounter: Payer: Self-pay | Admitting: Medical

## 2017-03-22 LAB — AFP TETRA
DIA Mom Value: 2.13
DIA Value (EIA): 356.25 pg/mL
DSR (BY AGE) 1 IN: 878
DSR (Second Trimester) 1 IN: 2548
GESTATIONAL AGE AFP: 19.1 wk
MSAFP MOM: 1.11
MSAFP: 50.2 ng/mL
MSHCG MOM: 0.65
MSHCG: 14620 m[IU]/mL
Maternal Age At EDD: 27.7 yr
OSB RISK: 8647
T18 (By Age): 1:3420 {titer}
Test Results:: NEGATIVE
UE3 MOM: 1.25
UE3 VALUE: 1.93 ng/mL
Weight: 170 [lb_av]

## 2017-03-27 ENCOUNTER — Encounter: Payer: Self-pay | Admitting: General Practice

## 2017-04-15 ENCOUNTER — Encounter: Payer: Self-pay | Admitting: General Practice

## 2017-04-15 NOTE — Progress Notes (Unsigned)
Patient was removed from baby scripts due to non compliance, has appt on 1/10

## 2017-04-18 ENCOUNTER — Encounter: Payer: Self-pay | Admitting: Certified Nurse Midwife

## 2017-04-19 ENCOUNTER — Telehealth: Payer: Self-pay | Admitting: General Practice

## 2017-04-19 NOTE — Telephone Encounter (Signed)
Patient missed appointment on 04/18/17.  Called patient to reschedule appointment.  Left message on VM for patient to give our office a call.  Patient is called scheduled for 04/30/17 at 9:15am.

## 2017-04-24 ENCOUNTER — Ambulatory Visit (HOSPITAL_COMMUNITY): Payer: Self-pay | Attending: Family Medicine

## 2017-04-30 ENCOUNTER — Encounter: Payer: Self-pay | Admitting: Student

## 2017-05-20 ENCOUNTER — Telehealth: Payer: Self-pay | Admitting: Obstetrics & Gynecology

## 2017-05-20 NOTE — Telephone Encounter (Signed)
Called patient because she does not have an appointment scheduled. No answer so I left a message for her to call us back ASAP.

## 2017-06-10 ENCOUNTER — Encounter: Payer: Self-pay | Admitting: Advanced Practice Midwife

## 2017-11-04 ENCOUNTER — Encounter (HOSPITAL_COMMUNITY): Payer: Self-pay

## 2018-12-06 IMAGING — US US OB COMP LESS 14 WK
1 series · 15 of 28 positions shown · non-contrast
Comparison: None.

CLINICAL DATA: 26-year-old pregnant female presents with 2 days of
vaginal spotting.

EDC by LMP: 08/10/2017, projecting to an expected gestational age of
6 weeks 3 days.
EXAM:
OBSTETRIC <14 WK US AND TRANSVAGINAL OB US
TECHNIQUE: Both transabdominal and transvaginal ultrasound examinations were
performed for complete evaluation of the gestation as well as the
maternal uterus, adnexal regions, and pelvic cul-de-sac.
Transvaginal technique was performed to assess early pregnancy.

[Series 1: us ob comp less 14 wk · 15 of 41 slices shown]
[im 1/41]
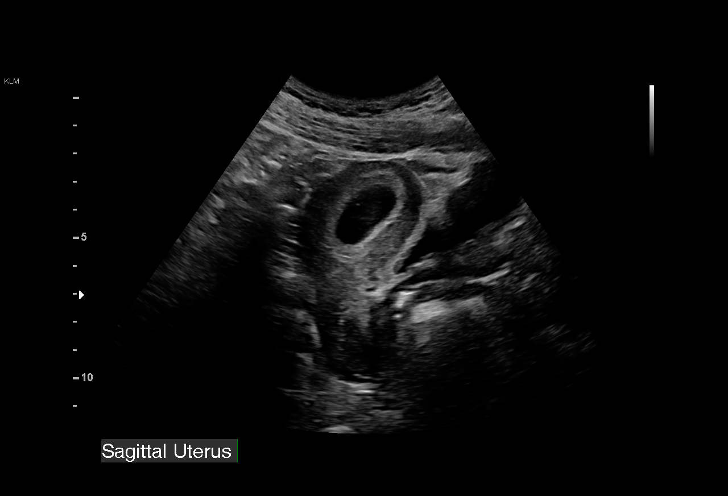
[im 3/41]
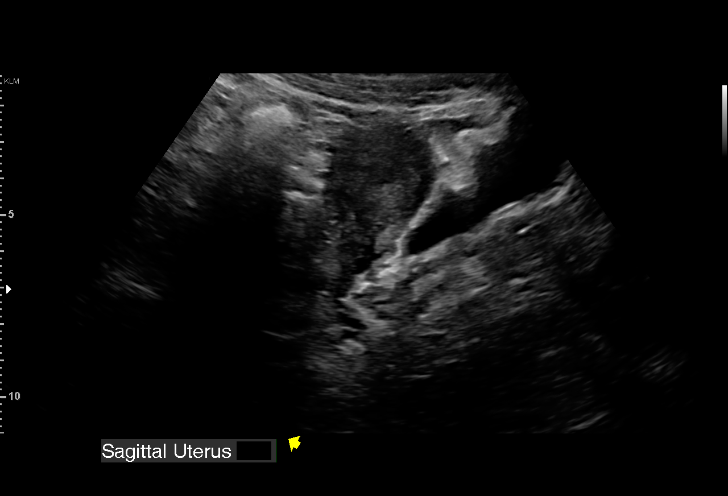
[im 6/41]
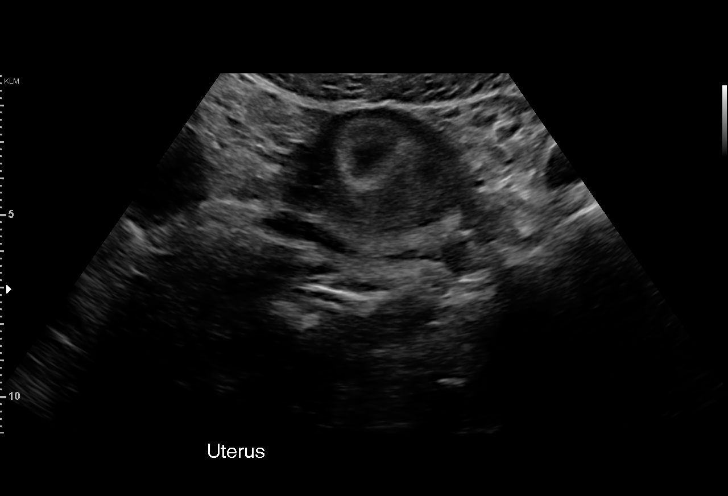
[im 9/41]
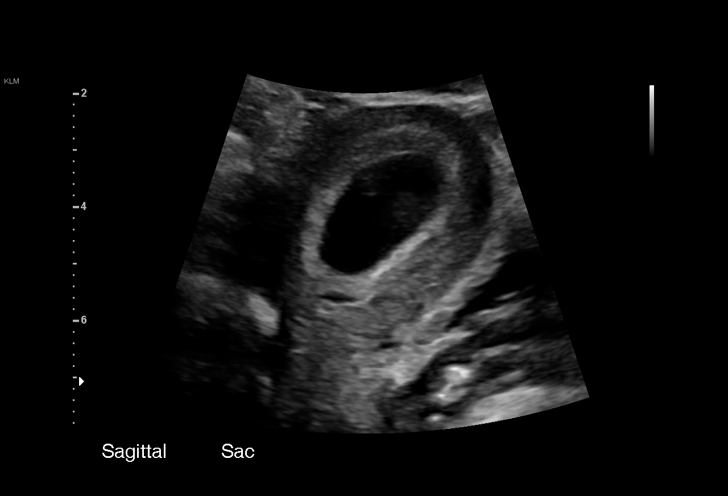
[im 12/41]
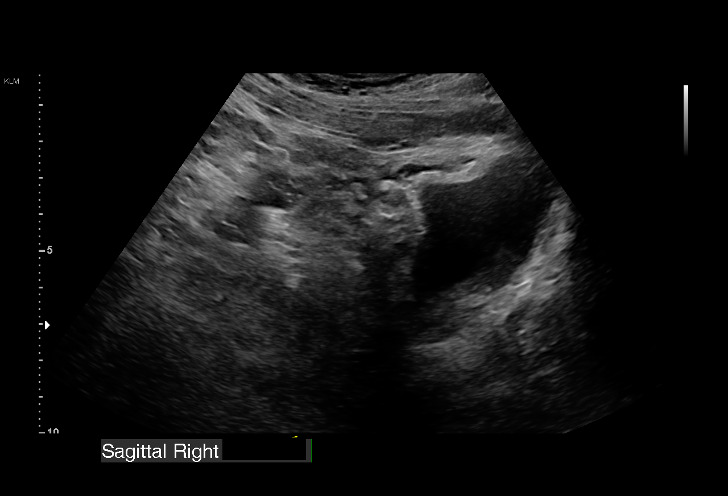
[im 15/41]
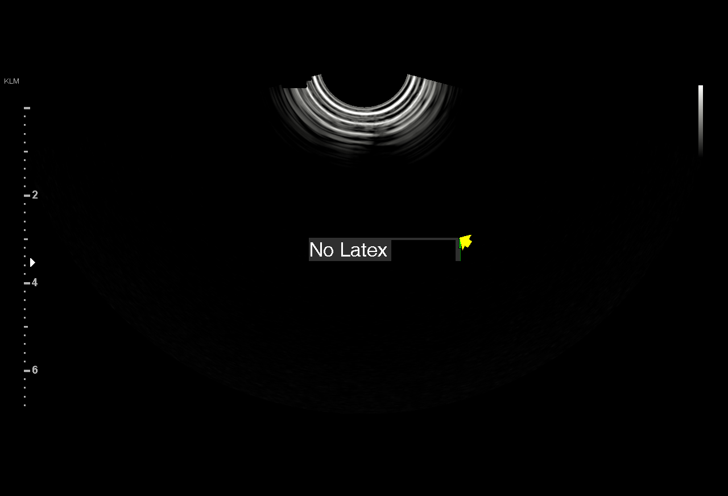
[im 18/41]
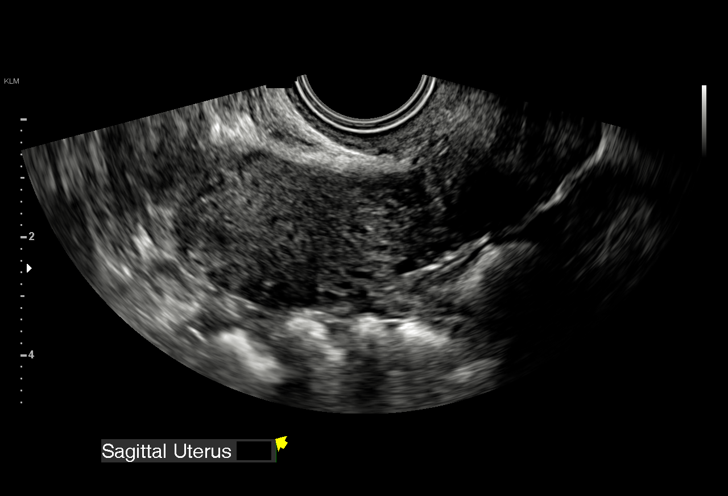
[im 21/41]
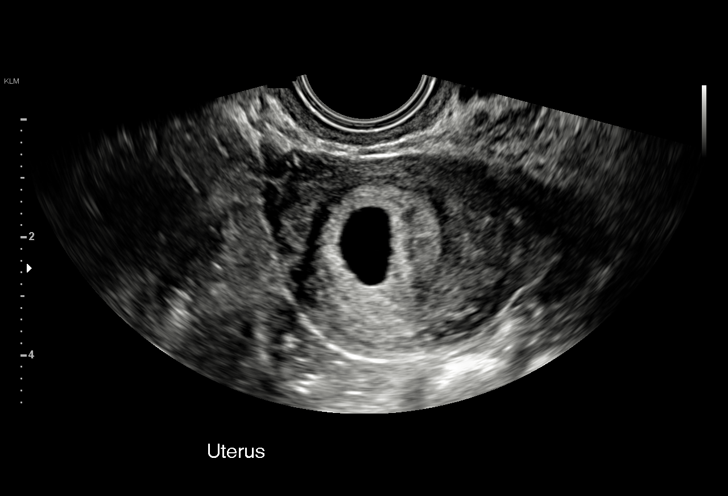
[im 23/41]
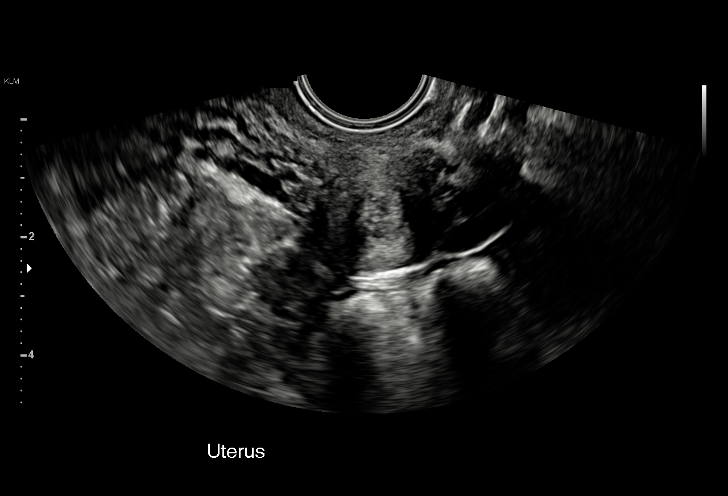
[im 26/41]
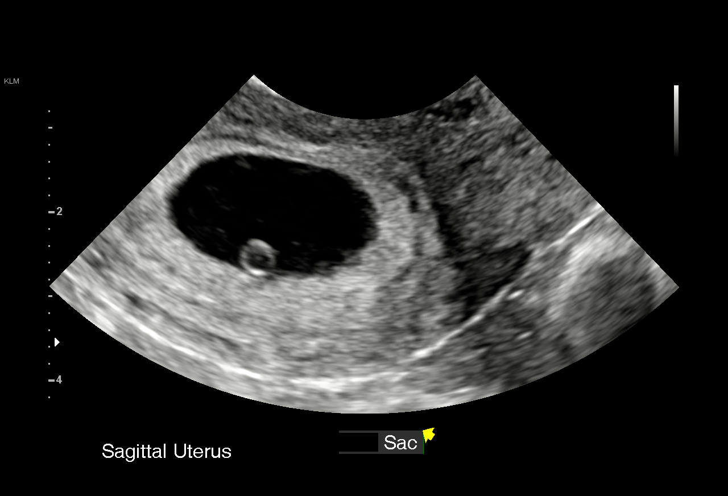
[im 29/41]
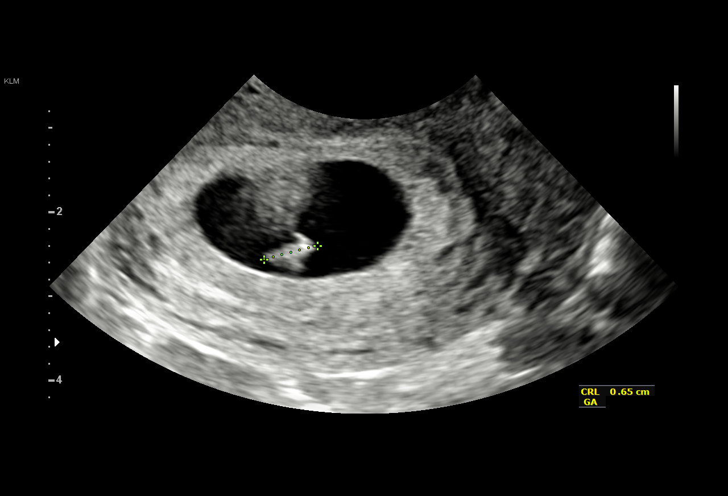
[im 32/41]
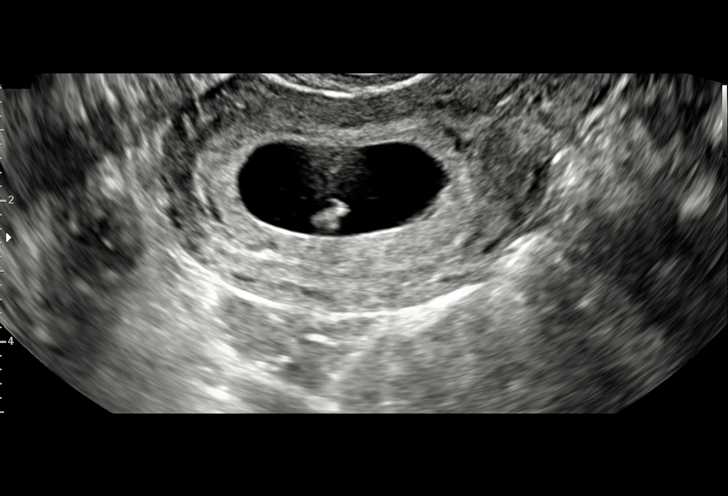
[im 35/41]
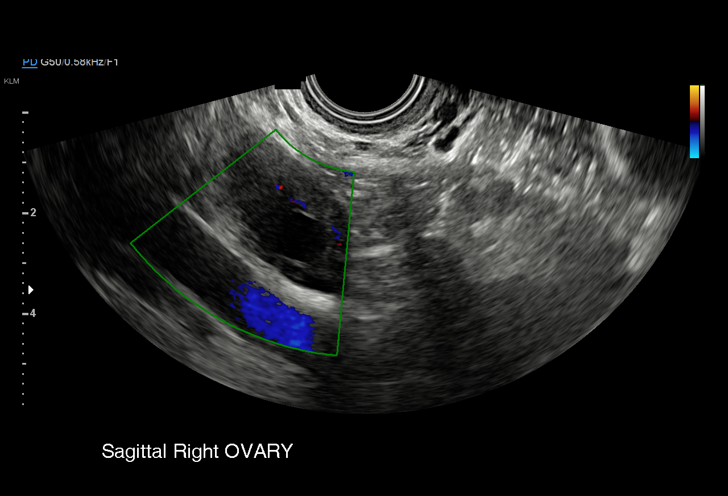
[im 38/41]
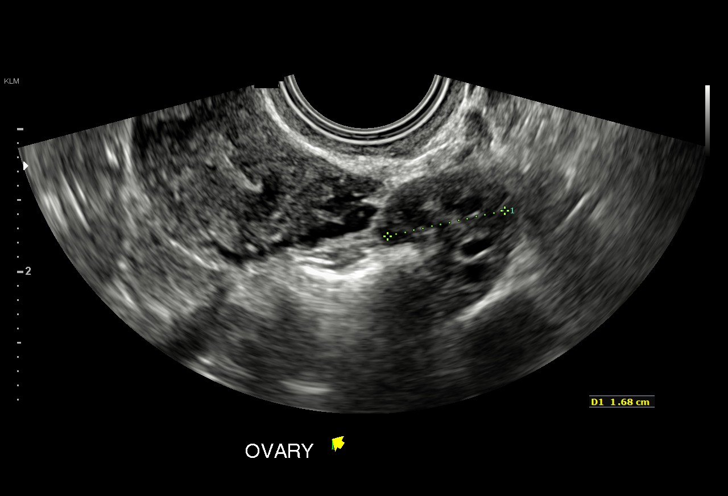
[im 41/41]
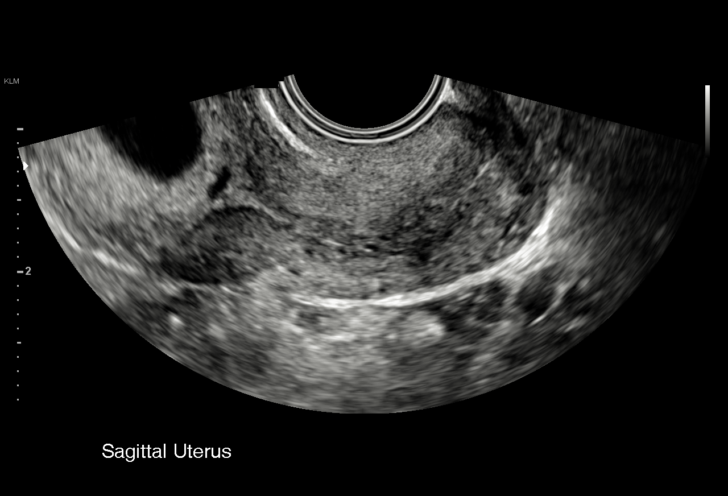

[15 of 28 positions shown; findings below may reference images not displayed]

FINDINGS: Intrauterine gestational sac: Single intrauterine gestational sac
appears normal in size, shape and position.

Yolk sac:  Visualized.

Embryo:  Visualized.

Embryonic Cardiac Activity: Visualized.

Embryonic Heart Rate: 115  bpm

CRL:  6.3  mm   6 w   3 d                  US EDC: 08/10/2017

Subchorionic hemorrhage:  None visualized.

Maternal uterus/adnexae: Anteverted uterus. No uterine fibroids. No
abnormal free fluid in the pelvis. Right ovary measures 3.4 x 1.7 x
1.8 cm and contains a corpus luteum. Left ovary measures 2.9 x 1.8 x
1.7 cm. No abnormal ovarian or adnexal masses.
IMPRESSION: 1. Single living intrauterine gestation at 6 weeks 3 days by
crown-rump length, concordant with provided menstrual dating.
Embryonic heart rate 115 bpm, probably normal for this early
gestational age. No acute early first-trimester gestational
abnormality.
2. No ovarian or adnexal abnormality.

## 2019-01-23 IMAGING — US US MFM FETAL NUCHAL TRANSLUCENCY
1 series · 15 of 28 positions shown · non-contrast
Comparison: none

[Series 1: us mfm fetal nuchal translucency · 15 of 39 slices shown]
[im 1/39]
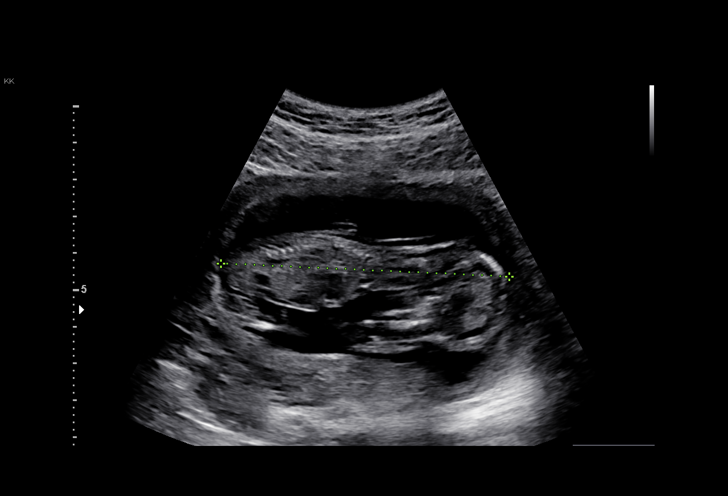
[im 3/39]
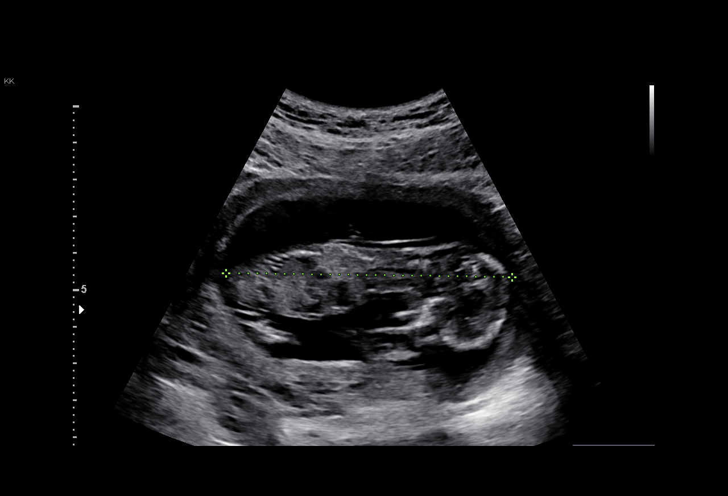
[im 6/39]
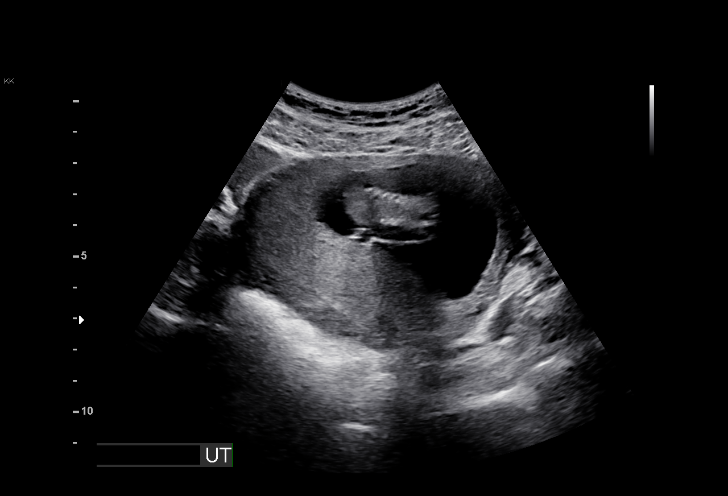
[im 9/39]
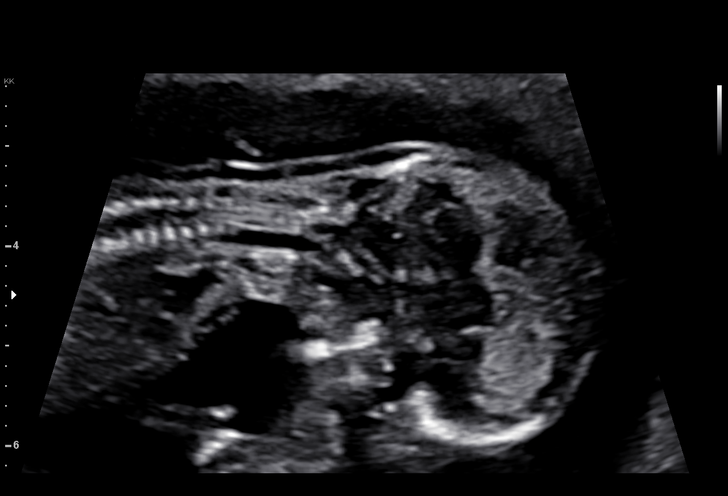
[im 12/39]
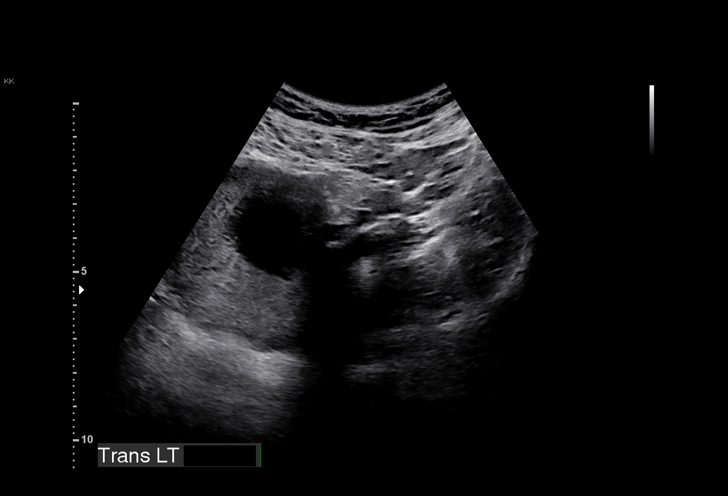
[im 15/39]
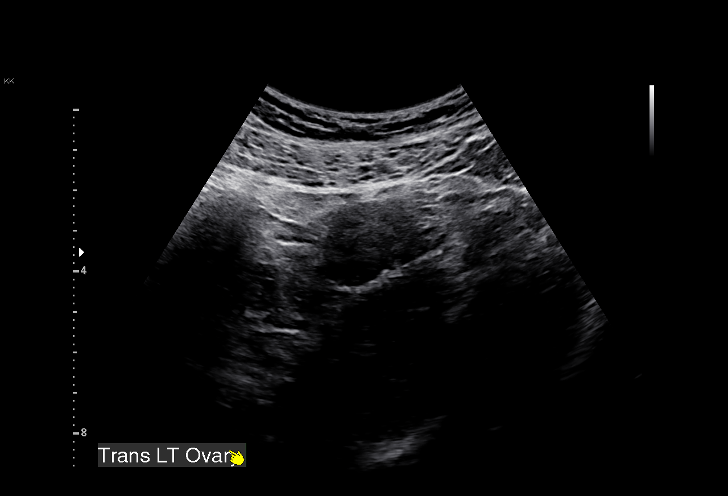
[im 17/39]
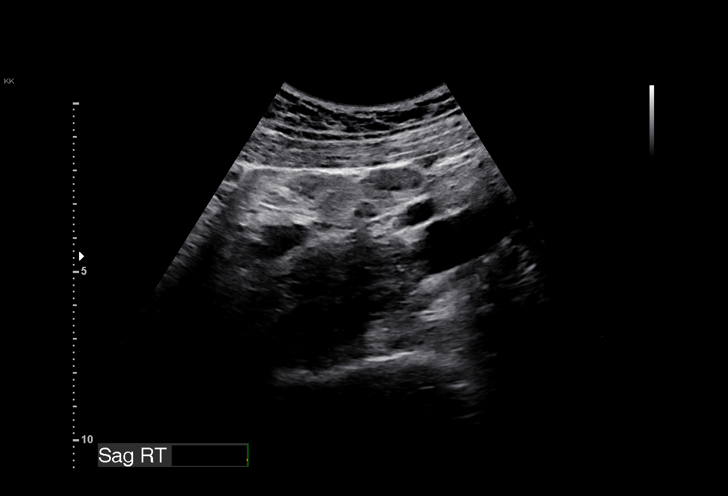
[im 20/39]
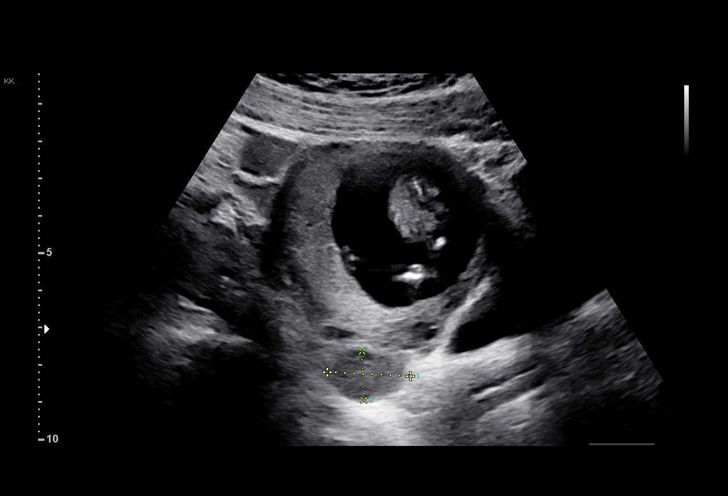
[im 22/39]
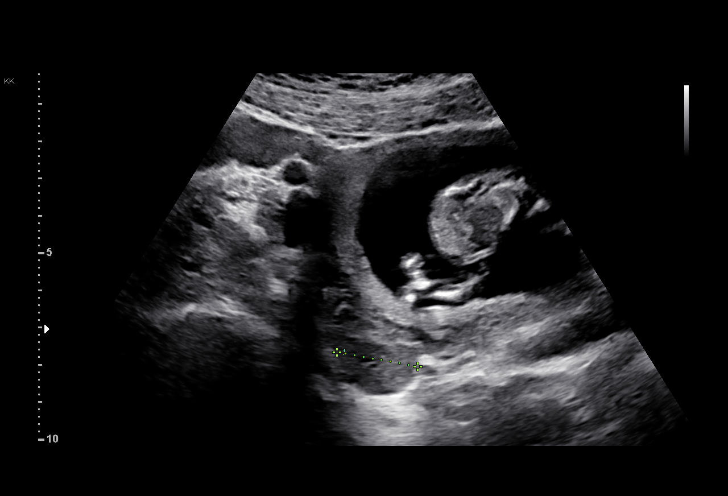
[im 24/39]
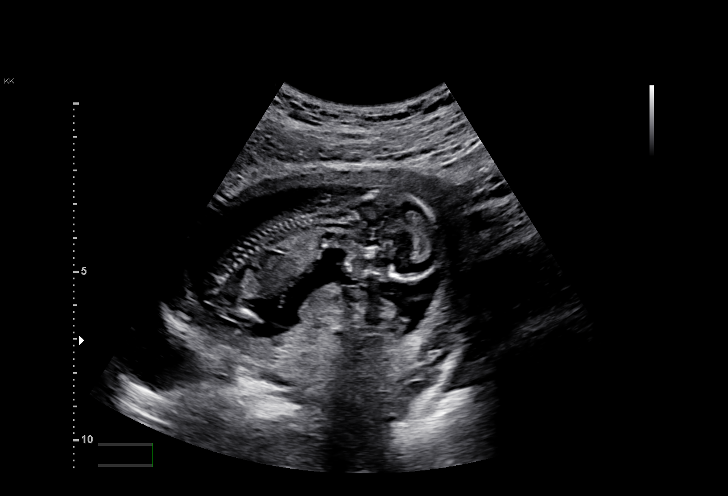
[im 27/39]
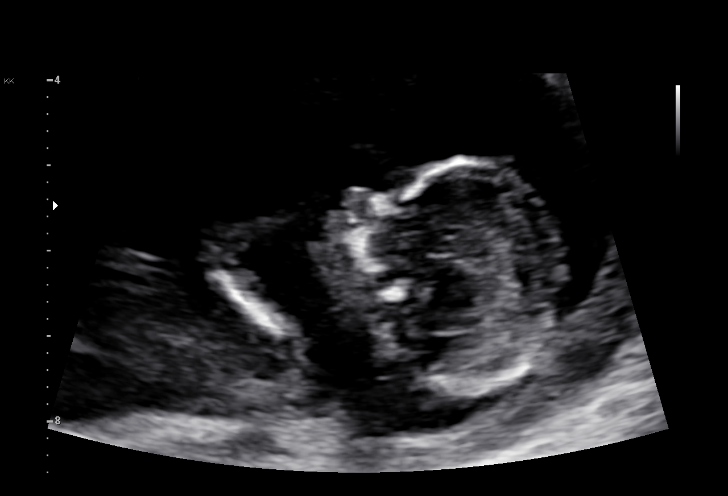
[im 30/39]
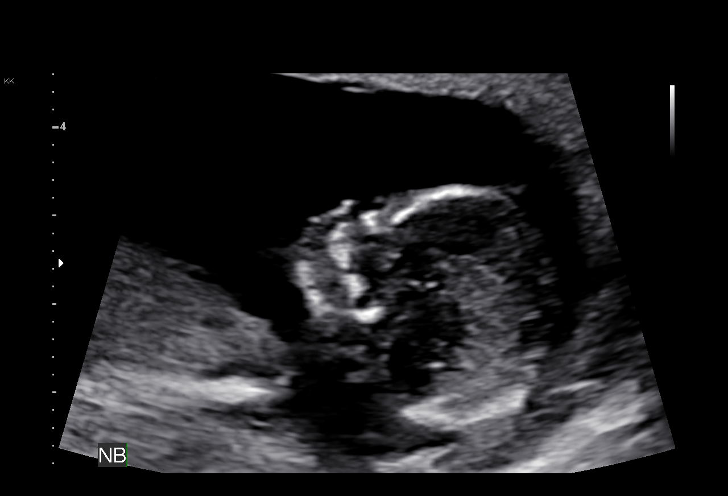
[im 33/39]
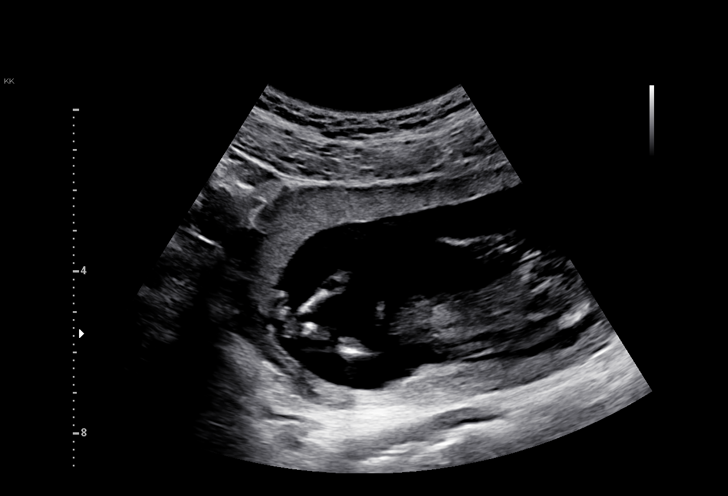
[im 36/39]
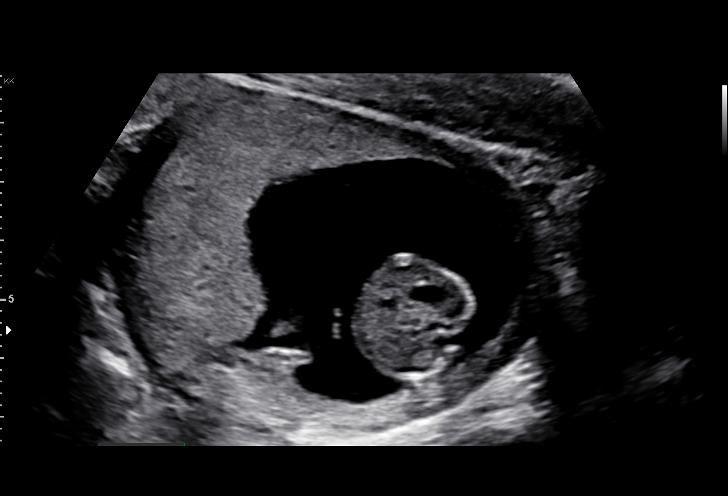
[im 39/39]
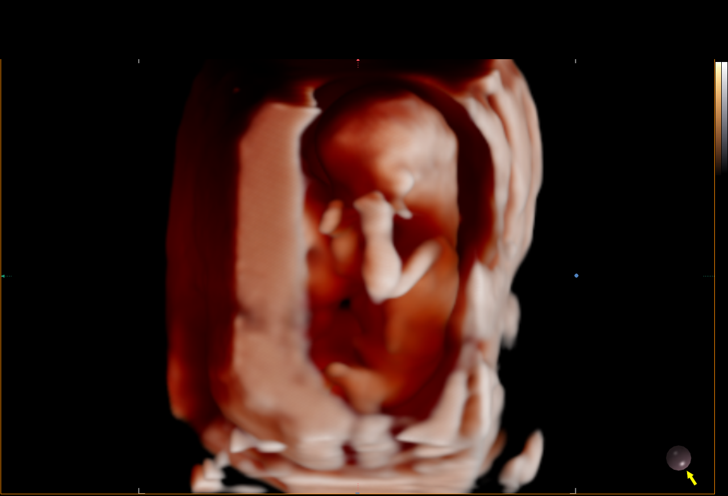

[15 of 28 positions shown; findings below may reference images not displayed]

TRANSLUCENCY

1  JESICAALEXANDRA HERNANDEZ AGUIRRE              [id number]      [id number]     [id number]
Indications

13 weeks gestation of pregnancy
Tobacco use complicating pregnancy, first
trimester
Encounter for nuchal translucency
OB History

Blood Type:            Height:  5'6"   Weight (lb):  161       BMI:
Gravidity:    2
TOP:          1        Living:  0
Fetal Evaluation

Num Of Fetuses:     1
Fetal Heart         171
Rate(bpm):
Cardiac Activity:   Observed
Presentation:       Variable

Amniotic Fluid
AFI FV:      Subjectively within normal limits
Biometry

CRL:        78  mm     G. Age:  13w 4d                  EDD:   08/08/17
Gestational Age

LMP:           13w 0d        Date:  11/05/16                 EDD:   08/12/17
Best:          13w 0d     Det. By:  LMP  (11/05/16)          EDD:   08/12/17
Cervix Uterus Adnexa

Uterus
No abnormality visualized.

Left Ovary
Within normal limits.

Right Ovary
Within normal limits.

Adnexa:       No abnormality visualized.
Impression

IUP at 13+0 weeks, here for first trimester screening
Normal fetal cardiac activity
Normal fetal morphology
CRL confirms menstrual dating
Unable to obtain usable NT measurement
Recommendations

Recommend anatomic survey in 6-8 weeks
Maternal serum marker screening for aneuploidy screen
when appropriate

## 2019-03-07 IMAGING — US US MFM OB COMP +14 WKS
1 series · 13 of 28 positions shown · non-contrast
Comparison: none

[Series 1: us mfm ob comp +14 wks · 13 of 97 slices shown]
[im 4/97]
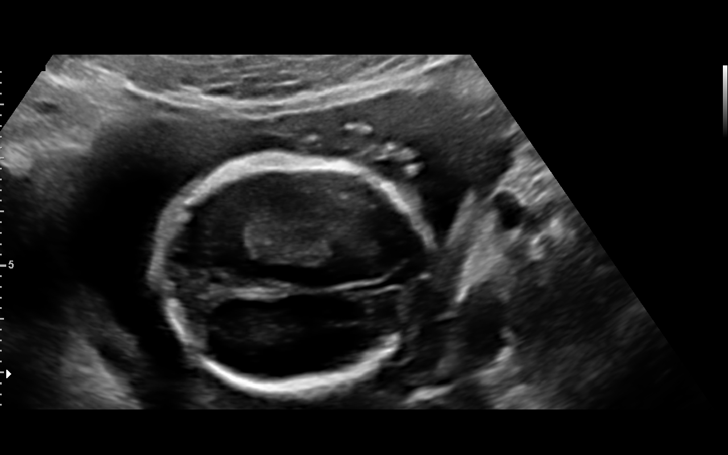
[im 11/97]
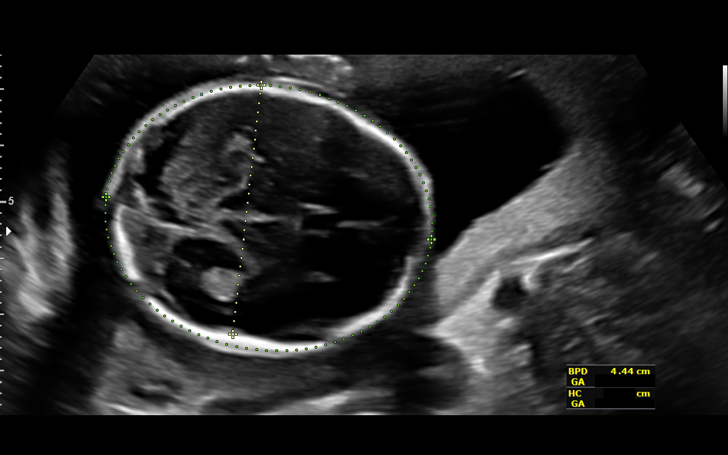
[im 18/97]
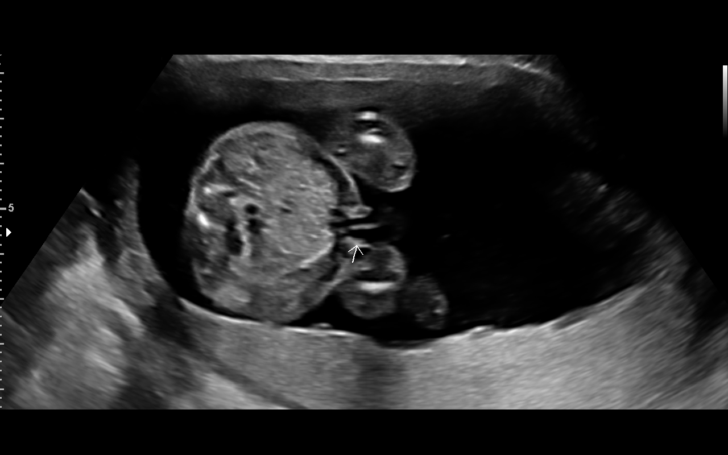
[im 25/97]
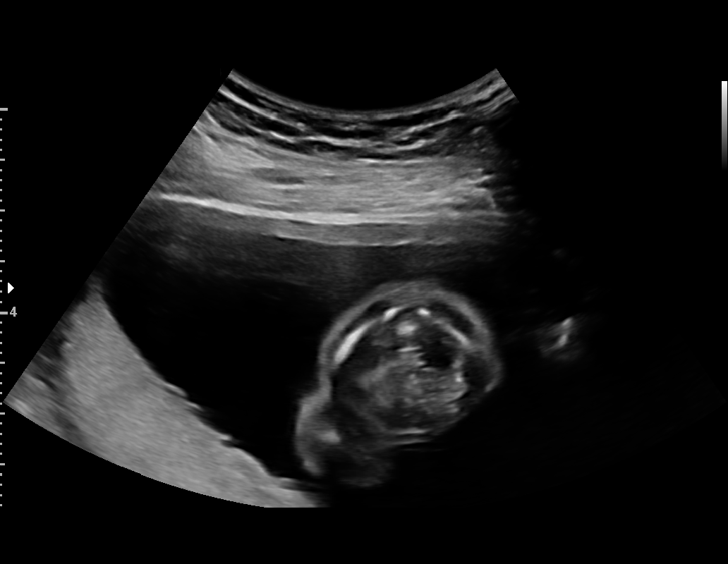
[im 33/97]
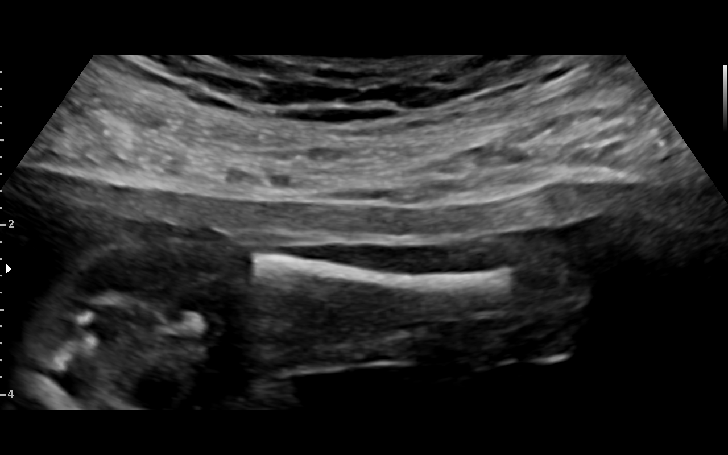
[im 40/97]
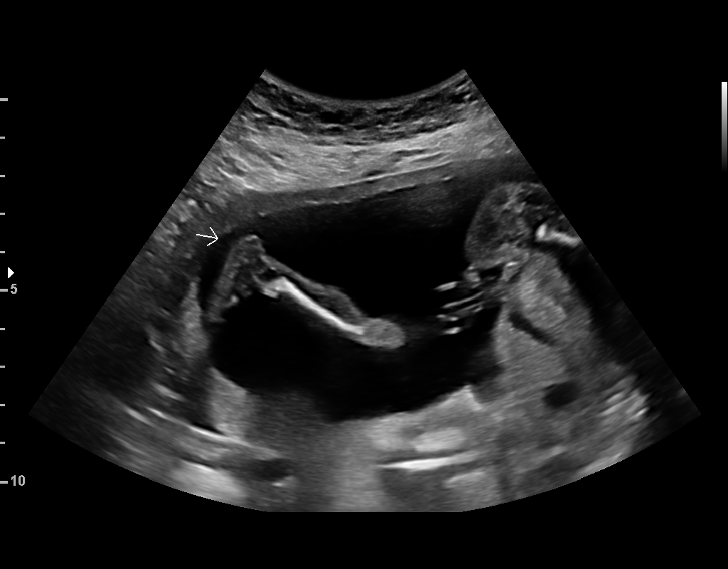
[im 50/97]
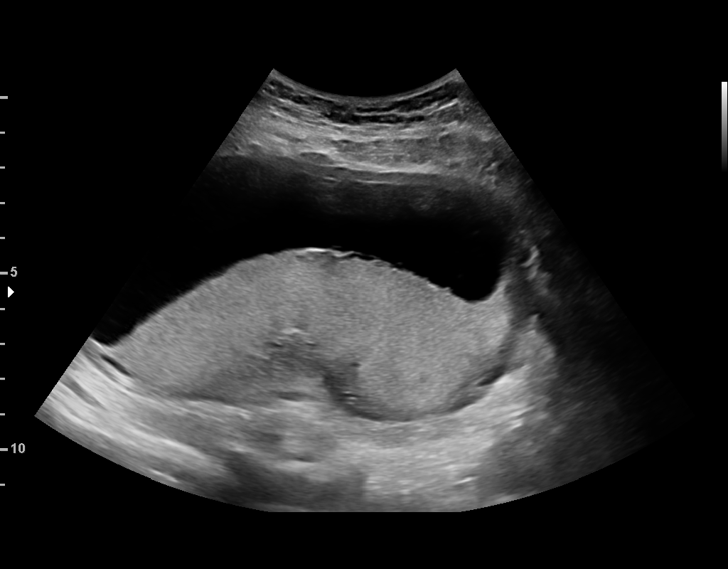
[im 57/97]
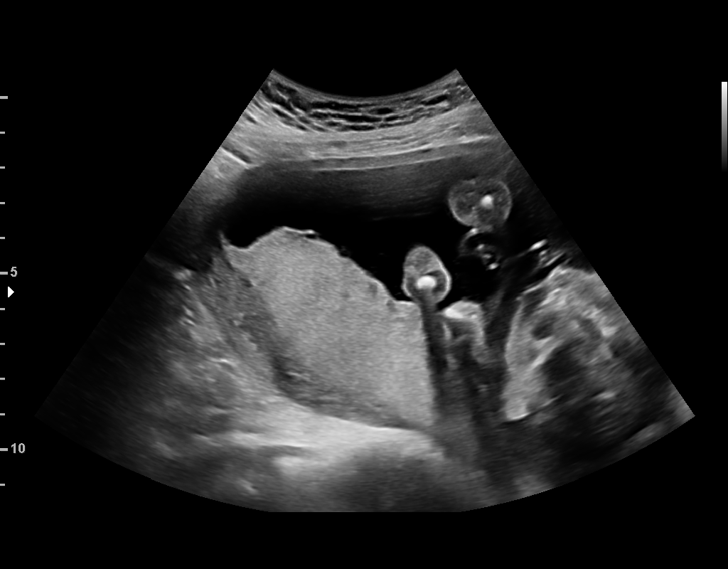
[im 65/97]
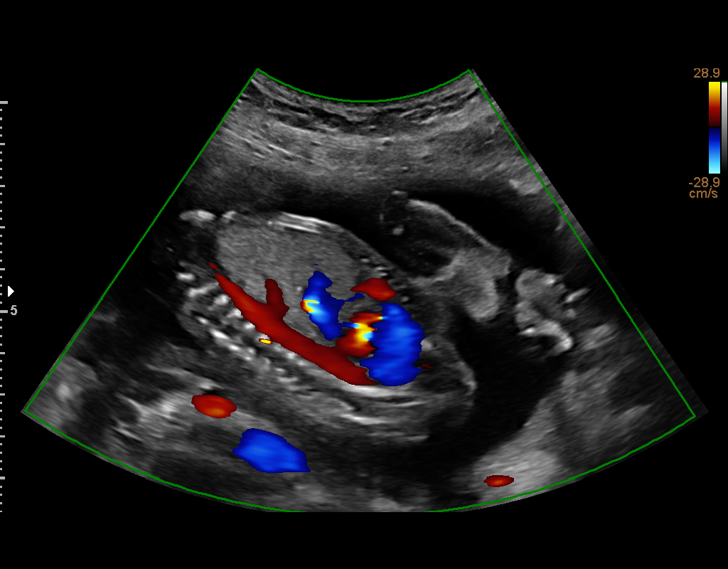
[im 72/97]
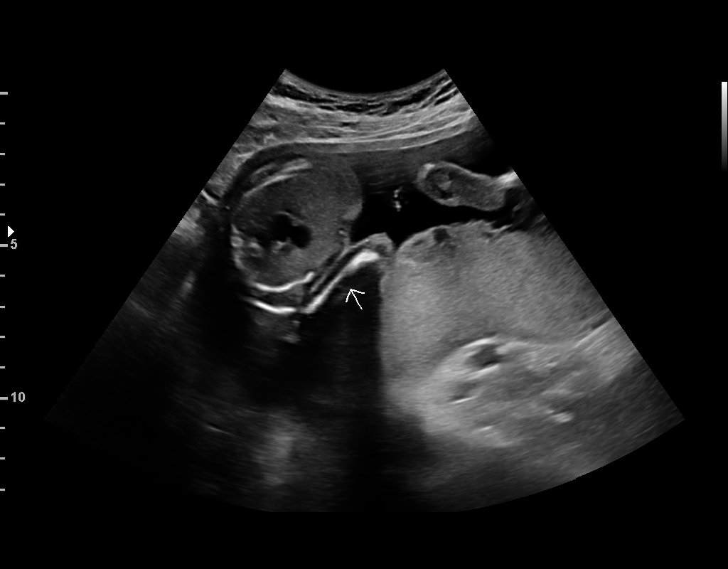
[im 79/97]
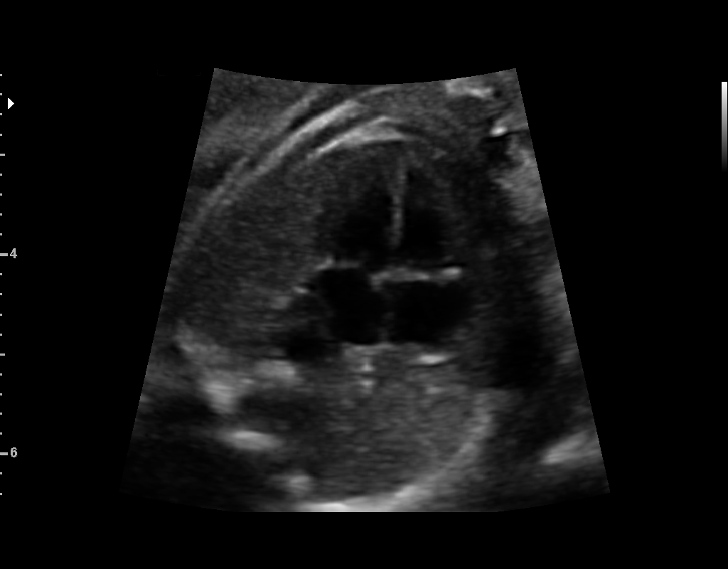
[im 86/97]
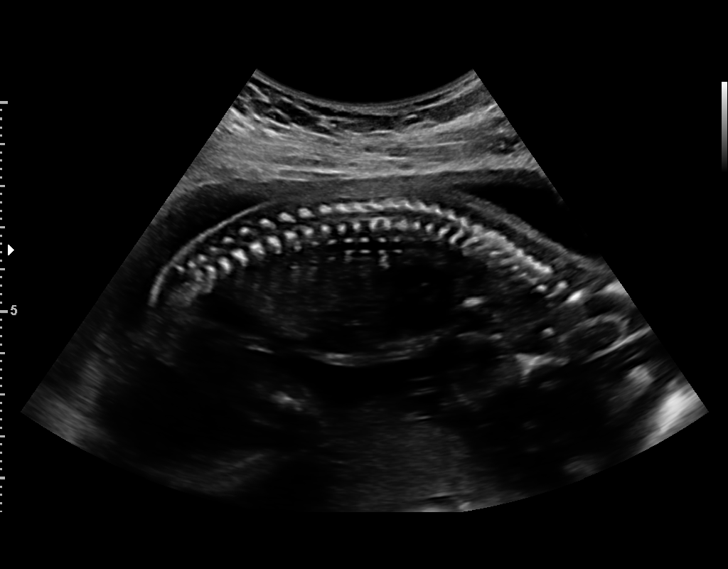
[im 93/97]
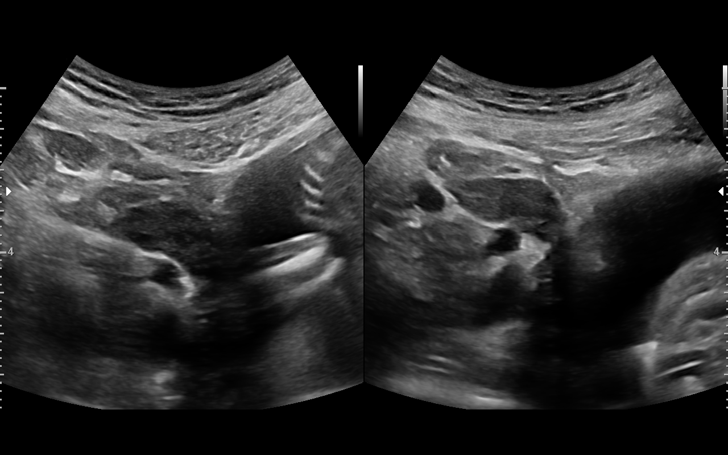

[13 of 28 positions shown; findings below may reference images not displayed]

1  FOKKE LAHAIJE              [id number]      [id number]     [id number]
Indications

19 weeks gestation of pregnancy
Tobacco use complicating pregnancy,
second trimester
Encounter for fetal anatomic survey
OB History

Blood Type:            Height:  5'6"   Weight (lb):  161       BMI:
Gravidity:    2
TOP:          1        Living:  0
Fetal Evaluation

Num Of Fetuses:     1
Fetal Heart         158
Rate(bpm):
Cardiac Activity:   Observed
Presentation:       Cephalic
Placenta:           Posterior, above cervical os
P. Cord Insertion:  Visualized

Largest Pocket(cm)
5.11
Biometry

BPD:      44.7  mm     G. Age:  19w 4d         67  %    CI:        74.01   %    70 - 86
FL/HC:      18.8   %    16.1 -
HC:       165   mm     G. Age:  19w 1d         47  %    HC/AC:      1.08        1.09 -
AC:      152.2  mm     G. Age:  20w 3d         83  %    FL/BPD:     69.6   %
FL:       31.1  mm     G. Age:  19w 4d         61  %    FL/AC:      20.4   %    20 - 24
HUM:      30.5  mm     G. Age:  20w 1d         75  %
CER:      20.1  mm     G. Age:  19w 1d         49  %
NFT:       3.7  mm
CM:        5.4  mm

Est. FW:     324  gm    0 lb 11 oz      58  %
Gestational Age

LMP:           19w 1d        Date:  11/05/16                 EDD:   08/12/17
U/S Today:     19w 5d                                        EDD:   08/08/17
Best:          19w 1d     Det. By:  LMP  (11/05/16)          EDD:   08/12/17
Anatomy

Cranium:               Appears normal         Aortic Arch:            Appears normal
Cavum:                 Appears normal         Ductal Arch:            Appears normal
Ventricles:            Appears normal         Diaphragm:              Not well visualized
Choroid Plexus:        Appears normal         Stomach:                Appears normal, left
sided
Cerebellum:            Appears normal         Abdomen:                Appears normal
Posterior Fossa:       Appears normal         Abdominal Wall:         Appears nml (cord
insert, abd wall)
Nuchal Fold:           Appears normal         Cord Vessels:           Appears normal (3
vessel cord)
Face:                  Appears normal         Kidneys:                Appear normal
(orbits and profile)
Lips:                  Not well visualized    Bladder:                Appears normal
Thoracic:              Appears normal         Spine:                  Appears normal
Heart:                 Appears normal         Upper Extremities:      Appears normal
(4CH, axis, and
situs)
RVOT:                  Not well visualized    Lower Extremities:      Appears normal
LVOT:                  Not well visualized

Other:  Fetus appears to be a male. Heels visualized. Technically difficult due
to fetal position.
Cervix Uterus Adnexa

Cervix
Length:           3.56  cm.
Normal appearance by transabdominal scan.

Uterus
No abnormality visualized.

Left Ovary
Size(cm)       2.5  x   1.3    x  2.4       Vol(ml):
Within normal limits.

Right Ovary
Size(cm)       3.2  x   1.2    x  2.2       Vol(ml):
Within normal limits.
Impression

[facility]leton intrauterine pregnancy at 19+1 weeks, here for
anatomic survey
Review of the anatomy shows no sonographic markers for
aneuploidy or structural anomalies
However, cardiac evaluation should be considered
suboptimal secondary to fetal position
Amniotic fluid volume is normal
Estimated fetal weight is 324g which is growth in the 58th
percentile
Recommendations

Recommend follow-up ultrasound examination in 4 weeks to
complete anatomic survey

## 2020-08-02 ENCOUNTER — Other Ambulatory Visit (HOSPITAL_COMMUNITY): Payer: Self-pay

## 2020-08-02 ENCOUNTER — Telehealth: Payer: Self-pay

## 2020-08-02 NOTE — Telephone Encounter (Signed)
[facility] Patient Product/process development scientist completed.    The patient is insured through [facility] [employer]  and has a $3.00 copay.  Medication will need a PA.  We will continue to follow to see if copay assistance is needed.  Clearance Coots, CPhT Specialty Pharmacy Patient [facility] Phone: [phone] Fax:  [phone]

## 2020-08-05 ENCOUNTER — Ambulatory Visit (INDEPENDENT_AMBULATORY_CARE_PROVIDER_SITE_OTHER): Payer: Medicaid Other | Admitting: Family

## 2020-08-05 ENCOUNTER — Encounter: Payer: Self-pay | Admitting: Family

## 2020-08-05 ENCOUNTER — Other Ambulatory Visit: Payer: Self-pay

## 2020-08-05 VITALS — BP 120/73 | HR 70 | Temp 98.0°F | Wt 230.0 lb

## 2020-08-05 DIAGNOSIS — B182 Chronic viral hepatitis C: Secondary | ICD-10-CM | POA: Diagnosis not present

## 2020-08-05 NOTE — Assessment & Plan Note (Signed)
Rebecca Weber is a 30 year old female with chronic hepatitis C likely obtained from previous history of IV drug use and does have a tattoo.  Currently treatment nave.  Does have nausea and fatigue.  No personal/family history of liver disease.  We reviewed the pathogenesis, risks of left untreated, and treatment options for hepatitis C.  Check genotype, fibrosis score, CBC, HIV, and hepatitis B status.  Plan for treatment pending blood work results with follow-up 1 month after starting medication.

## 2020-08-05 NOTE — Patient Instructions (Signed)
Nice to see you.  We will check your lab work today and call you with results.   Let us know if you have any questions.  Plan for follow up in 1 month after starting medication.  Have a great day and stay safe!  Limit acetaminophen (Tylenol) usage to no more than 2 grams (2,000 mg) per day.  Avoid alcohol.  Do not share toothbrushes or razors.  Practice safe sex to protect against transmission as well as sexually transmitted disease.    Hepatitis C Hepatitis C is a viral infection of the liver. It can lead to scarring of the liver (cirrhosis), liver failure, or liver cancer. Hepatitis C may go undetected for months or years because people with the infection may not have symptoms, or they may have only mild symptoms. What are the causes? This condition is caused by the hepatitis C virus (HCV). The virus can spread from person to person (is contagious) through:  Blood.  Childbirth. A woman who has hepatitis C can pass it to her baby during birth.  Bodily fluids, such as breast milk, tears, semen, vaginal fluids, and saliva.  Blood transfusions or organ transplants done in the [place] before 1992.  What increases the risk? The following factors may make you more likely to develop this condition:  Having contact with unclean (contaminated) needles or syringes. This may result from: ? Acupuncture. ? Tattoing. ? Body piercing. ? Injecting drugs.  Having unprotected sex with someone who is infected.  Needing treatment to filter your blood (kidney dialysis).  Having HIV (human immunodeficiency virus) or AIDS (acquired immunodeficiency syndrome).  Working in a job that involves contact with blood or bodily fluids, such as health care.  What are the signs or symptoms? Symptoms of this condition include:  Fatigue.  Loss of appetite.  Nausea.  Vomiting.  Abdominal pain.  Dark yellow urine.  Yellowish skin and eyes (jaundice).  Itchy skin.  Clay-colored  bowel movements.  Joint pain.  Bleeding and bruising easily.  Fluid building up in your stomach (ascites).  In some cases, you may not have any symptoms. How is this diagnosed? This condition is diagnosed with:  Blood tests.  Other tests to check how well your liver is functioning. They may include: ? Magnetic resonance elastography (MRE). This imaging test uses MRIs and sound waves to measure liver stiffness. ? Transient elastography. This imaging test uses ultrasounds to measure liver stiffness. ? Liver biopsy. This test requires taking a small tissue sample from your liver to examine it under a microscope.  How is this treated? Your health care provider may perform noninvasive tests or a liver biopsy to help decide the best course of treatment. Treatment may include:  Antiviral medicines and other medicines.  Follow-up treatments every 6-12 months for infections or other liver conditions.  Receiving a donated liver (liver transplant).  Follow these instructions at home: Medicines  Take over-the-counter and prescription medicines only as told by your health care provider.  Take your antiviral medicine as told by your health care provider. Do not stop taking the antiviral even if you start to feel better.  Do not take any medicines unless approved by your health care provider, including over-the-counter medicines and birth control pills. Activity  Rest as needed.  Do not have sex unless approved by your health care provider.  Ask your health care provider when you may return to school or work. Eating and drinking  Eat a balanced diet with plenty of fruits and  vegetables, whole grains, and lowfat (lean) meats or non-meat proteins (such as beans or tofu).  Drink enough fluids to keep your urine clear or pale yellow.  Do not drink alcohol. General instructions  Do not share toothbrushes, nail clippers, or razors.  Wash your hands frequently with soap and water. If  soap and water are not available, use hand sanitizer.  Cover any cuts or open sores on your skin to prevent spreading the virus.  Keep all follow-up visits as told by your health care provider. This is important. You may need follow-up visits every 6-12 months. How is this prevented? There is no vaccine for hepatitis C. The only way to prevent the disease is to reduce the risk of exposure to the virus. Make sure you:  Wash your hands frequently with soap and water. If soap and water are not available, use hand sanitizer.  Do not share needles or syringes.  Practice safe sex and use condoms.  Avoid handling blood or bodily fluids without gloves or other protection.  Avoid getting tattoos or piercings in shops or other locations that are not clean.  Contact a health care provider if:  You have a fever.  You develop abdominal pain.  You pass dark urine.  You pass clay-colored stools.  You develop joint pain. Get help right away if:  You have increasing fatigue or weakness.  You lose your appetite.  You cannot eat or drink without vomiting.  You develop jaundice or your jaundice gets worse.  You bruise or bleed easily. Summary  Hepatitis C is a viral infection of the liver. It can lead to scarring of the liver (cirrhosis), liver failure, or liver cancer.  The hepatitis C virus (HCV) causes this condition. The virus can pass from person to person (is contagious).  You should not take any medicines unless approved by your health care provider. This includes over-the-counter medicines and birth control pills. This information is not intended to replace advice given to you by your health care provider. Make sure you discuss any questions you have with your health care provider. Document Released: 03/23/2000 Document Revised: 05/01/2016 Document Reviewed: 05/01/2016 Elsevier Interactive Patient Education  [employer].

## 2020-08-05 NOTE — Progress Notes (Signed)
Subjective:    Patient ID: Rebecca Weber, female    DOB: [birth year], 30 y.o.   MRN: [id number]  Chief Complaint  Patient presents with  . New Patient (Initial Visit)    Hep C    HPI:  Rebecca Weber is a 30 y.o. female with no significant previous medical history presents today for initial evaluation and treatment for Hepatitis C.   Rebecca Weber was initially diagnosed with hepatitis C in May 2019 when she gave birth to her son.  Risk factors for hepatitis C include IV drug use and tattoos.  Denies blood transfusions prior to 1992 or sharing of razors/toothbrushes.  No treatment since diagnosis.  Does have nausea and increased levels of fatigue and denies abdominal pain, diarrhea, scleral icterus, or jaundice.  No personal or family history of liver disease.  No current recreational or illicit drug use and remains a every day smoker of 1/4 pack/day on average with occasional alcohol consumption.  Previous blood work reviewed with viral load of 1.4 million.    Allergies  Allergen Reactions  . Sulfa Antibiotics Rash      Outpatient Medications Prior to Visit  Medication Sig Dispense Refill  . SHAROBEL 0.35 MG tablet Take 1 tablet by mouth daily.    Marland Kitchen acetaminophen (TYLENOL) 500 MG tablet Take 500 mg by mouth every 6 (six) hours as needed for mild pain. (Patient not taking: Reported on 08/05/2020)    . Prenatal Vit-Fe Fumarate-FA (MULTIVITAMIN-PRENATAL) 27-0.8 MG TABS tablet Take 1 tablet by mouth daily at 12 noon. (Patient not taking: Reported on 08/05/2020)     No facility-administered medications prior to visit.     Past Medical History:  Diagnosis Date  . ADHD   . Depression   . Medical history non-contributory       Past Surgical History:  Procedure Laterality Date  . NO PAST SURGERIES        Family History  Problem Relation Age of Onset  . Diabetes Mother       Social History   Socioeconomic History  . Marital status: Single    Spouse name: Not on file  . Number  of children: Not on file  . Years of education: Not on file  . Highest education level: Not on file  Occupational History  . Not on file  Tobacco Use  . Smoking status: Current Every Day Smoker    Packs/day: 0.25    Types: Cigarettes  . Smokeless tobacco: Never Used  Vaping Use  . Vaping Use: Never used  Substance and Sexual Activity  . Alcohol use: Yes    Comment: occ  . Drug use: No  . Sexual activity: Yes  Other Topics Concern  . Not on file  Social History Narrative  . Not on file   Social Determinants of Health   Financial Resource Strain: Not on file  Food Insecurity: Not on file  Transportation Needs: Not on file  Physical Activity: Not on file  Stress: Not on file  Social Connections: Not on file  Intimate Partner Violence: Not on file      Review of Systems  Constitutional: Positive for fatigue. Negative for chills, fever and unexpected weight change.  Respiratory: Negative for cough, chest tightness, shortness of breath and wheezing.   Cardiovascular: Negative for chest pain and leg swelling.  Gastrointestinal: Positive for nausea. Negative for abdominal distention, constipation, diarrhea and vomiting.  Neurological: Negative for dizziness, weakness, light-headedness and headaches.  Hematological: Does not bruise/bleed easily.  Objective:    BP 120/73   Pulse 70   Temp 98 F (36.7 C) (Oral)   Wt 230 lb (104.3 kg)   BMI 37.12 kg/m  Nursing note and vital signs reviewed.  Physical Exam Constitutional:      General: She is not in acute distress.    Appearance: She is well-developed.  Cardiovascular:     Rate and Rhythm: Normal rate and regular rhythm.     Heart sounds: Normal heart sounds. No murmur heard. No friction rub. No gallop.   Pulmonary:     Effort: Pulmonary effort is normal. No respiratory distress.     Breath sounds: Normal breath sounds. No wheezing or rales.  Chest:     Chest wall: No tenderness.  Abdominal:     General:  Bowel sounds are normal. There is no distension.     Palpations: Abdomen is soft. There is no mass.     Tenderness: There is no abdominal tenderness. There is no guarding or rebound.  Skin:    General: Skin is warm and dry.  Neurological:     Mental Status: She is alert and oriented to person, place, and time.  Psychiatric:        Behavior: Behavior normal.        Thought Content: Thought content normal.        Judgment: Judgment normal.         Assessment & Plan:   Patient Active Problem List   Diagnosis Date Noted  . Chronic hepatitis C without hepatic coma (HCC) 08/05/2020  . Supervision of low-risk pregnancy 02/01/2017  . 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  Currently treatment nave.  Does have nausea and fatigue.  No personal/family history of liver disease.  We reviewed the pathogenesis, risks of left untreated, and treatment options for hepatitis C.  Check genotype, fibrosis score, CBC, HIV, and hepatitis B status.  Plan for treatment pending blood work results with follow-up 1 month after starting medication.      Relevant Orders   Hepatitis C genotype   Hepatitis C RNA quantitative   Liver Fibrosis, FibroTest-ActiTest   Protime-INR   Hepatitis B surface antibody,qualitative   Hepatitis B surface antigen   HIV Antibody (routine testing w rflx)   CBC       I have discontinued Fredric Mare Koehl's acetaminophen and multivitamin-prenatal. I am also having her maintain her Sharobel.    Follow-up: Plan for follow-up 1 month after the initiation of medication.    Marcos Eke, MSN, FNP-C Nurse Practitioner [facility] for [facility] Main number: [phone]

## 2020-08-10 LAB — HEPATITIS C RNA QUANTITATIVE
HCV Quantitative Log: 6.48 log IU/mL — ABNORMAL HIGH
HCV RNA, PCR, QN: 3050000 IU/mL — ABNORMAL HIGH

## 2020-08-10 LAB — CBC
HCT: 43.8 % (ref 35.0–45.0)
Hemoglobin: 15.1 g/dL (ref 11.7–15.5)
MCH: 32.1 pg (ref 27.0–33.0)
MCHC: 34.5 g/dL (ref 32.0–36.0)
MCV: 93 fL (ref 80.0–100.0)
MPV: 9.7 fL (ref 7.5–12.5)
Platelets: 257 10*3/uL (ref 140–400)
RBC: 4.71 10*6/uL (ref 3.80–5.10)
RDW: 12.9 % (ref 11.0–15.0)
WBC: 7.7 10*3/uL (ref 3.8–10.8)

## 2020-08-10 LAB — LIVER FIBROSIS, FIBROTEST-ACTITEST
ALT: 44 U/L — ABNORMAL HIGH (ref 6–29)
Alpha-2-Macroglobulin: 137 mg/dL (ref 106–279)
Apolipoprotein A1: 135 mg/dL (ref 101–198)
Bilirubin: 0.4 mg/dL (ref 0.2–1.2)
Fibrosis Score: 0.05
GGT: 38 U/L (ref 3–50)
Haptoglobin: 162 mg/dL (ref 43–212)
Necroinflammat ACT Score: 0.18
Reference ID: 3847245

## 2020-08-10 LAB — HEPATITIS C GENOTYPE: HCV Genotype: 3

## 2020-08-10 LAB — PROTIME-INR
INR: 0.9
Prothrombin Time: 9.5 s (ref 9.0–11.5)

## 2020-08-10 LAB — HEPATITIS B SURFACE ANTIGEN: Hepatitis B Surface Ag: NONREACTIVE

## 2020-08-10 LAB — HIV ANTIBODY (ROUTINE TESTING W REFLEX): HIV 1&2 Ab, 4th Generation: NONREACTIVE

## 2020-08-10 LAB — HEPATITIS B SURFACE ANTIBODY,QUALITATIVE: Hep B S Ab: NONREACTIVE

## 2020-08-11 ENCOUNTER — Telehealth: Payer: Self-pay

## 2020-08-11 ENCOUNTER — Other Ambulatory Visit (HOSPITAL_COMMUNITY): Payer: Self-pay

## 2020-08-11 ENCOUNTER — Other Ambulatory Visit: Payer: Self-pay | Admitting: Family

## 2020-08-11 DIAGNOSIS — B182 Chronic viral hepatitis C: Secondary | ICD-10-CM

## 2020-08-11 MED ORDER — MAVYRET 100-40 MG PO TABS
3.0000 | ORAL_TABLET | Freq: Every day | ORAL | 1 refills | Status: DC
  Filled 2020-08-11: qty 84, fill #0
  Filled 2020-08-12: qty 84, 28d supply, fill #0
  Filled 2020-09-06: qty 84, 28d supply, fill #1

## 2020-08-11 NOTE — Telephone Encounter (Signed)
[facility] Patient Advocate Encounter  Prior Authorization for Mavyret has been approved.    PA# [id number] Effective dates: 08/11/20 through 10/06/20  Patients co-pay is $3.00.   The prescription can be filled at [facility]  [facility] Clinic will continue to follow.  Clearance Coots, CPhT Specialty Pharmacy Patient [facility] Phone: [phone] Fax:  [phone]

## 2020-08-11 NOTE — Telephone Encounter (Signed)
[facility] Patient Advocate Encounter   Received notification from [facility] that prior authorization for Mavyret is required.   PA submitted on 08/11/20 Key [id number] Status is pending    [facility] Clinic will continue to follow.   Clearance Coots, CPhT Specialty Pharmacy Patient [facility] for Infectious Disease Phone: [phone] Fax:  [phone]

## 2020-08-12 ENCOUNTER — Other Ambulatory Visit (HOSPITAL_COMMUNITY): Payer: Self-pay

## 2020-08-26 ENCOUNTER — Other Ambulatory Visit (HOSPITAL_COMMUNITY): Payer: Self-pay

## 2020-09-06 ENCOUNTER — Other Ambulatory Visit (HOSPITAL_COMMUNITY): Payer: Self-pay

## 2020-09-07 ENCOUNTER — Other Ambulatory Visit (HOSPITAL_COMMUNITY): Payer: Self-pay

## 2020-09-16 ENCOUNTER — Other Ambulatory Visit: Payer: Self-pay

## 2020-09-16 ENCOUNTER — Ambulatory Visit (INDEPENDENT_AMBULATORY_CARE_PROVIDER_SITE_OTHER): Payer: Medicaid Other | Admitting: Pharmacist

## 2020-09-16 DIAGNOSIS — Z23 Encounter for immunization: Secondary | ICD-10-CM | POA: Diagnosis not present

## 2020-09-16 DIAGNOSIS — B182 Chronic viral hepatitis C: Secondary | ICD-10-CM

## 2020-09-16 NOTE — Progress Notes (Signed)
HPI: Rebecca Weber is a 30 y.o. female who presents to the [facility] pharmacy clinic for Hepatitis C follow-up.  Medication: Mavyret x 8 weeks  Start Date: 08/12/20  Hepatitis C Genotype: 3  Fibrosis Score: F0  Hepatitis C RNA: 3,050,000 (07/26/20)  Patient Active Problem List   Diagnosis Date Noted   Chronic hepatitis C without hepatic coma (HCC) 08/05/2020   Supervision of low-risk pregnancy 02/01/2017   Tobacco smoking affecting pregnancy 02/01/2017    Patient's Medications  New Prescriptions   No medications on file  Previous Medications   GLECAPREVIR-PIBRENTASVIR (MAVYRET) 100-40 MG TABS    Take 3 tablets by mouth daily. Take 3 tablets by mouth daily with a meal.   SHAROBEL 0.35 MG TABLET    Take 1 tablet by mouth daily.  Modified Medications   No medications on file  Discontinued Medications   No medications on file    Allergies: Allergies  Allergen Reactions   Sulfa Antibiotics Rash    Past Medical History: Past Medical History:  Diagnosis Date   ADHD    Depression    Medical history non-contributory     Social History: Social History   Socioeconomic History   Marital status: Single    Spouse name: Not on file   Number of children: Not on file   Years of education: Not on file   Highest education level: Not on file  Occupational History   Not on file  Tobacco Use   Smoking status: Every Day    Packs/day: 0.25    Pack years: 0.00    Types: Cigarettes   Smokeless tobacco: Never  Vaping Use   Vaping Use: Never used  Substance and Sexual Activity   Alcohol use: Yes    Comment: occ   Drug use: No   Sexual activity: Yes  Other Topics Concern   Not on file  Social History Narrative   Not on file   Social Determinants of Health   Financial Resource Strain: Not on file  Food Insecurity: Not on file  Transportation Needs: Not on file  Physical Activity: Not on file  Stress: Not on file  Social Connections: Not on file    Labs: Hepatitis  C Lab Results  Component Value Date   HCVGENOTYPE 3 08/05/2020   HCVRNAPCRQN 3,050,000 (H) 08/05/2020   FIBROSTAGE F0 08/05/2020   Hepatitis B Lab Results  Component Value Date   HEPBSAB NON-REACTIVE 08/05/2020   HEPBSAG NON-REACTIVE 08/05/2020   Hepatitis A No results found for: HAV HIV Lab Results  Component Value Date   HIV NON-REACTIVE 08/05/2020   HIV Non Reactive 02/01/2017   No results found for: CREATININE Lab Results  Component Value Date   ALT 44 (H) 08/05/2020   INR 0.9 08/05/2020    Assessment: Rebecca Weber presents for 1 month follow up visit for hepatitis C treatment. She is taking Mavyret daily at 12:40pm, all three tablets at the same time, with food. Reports some diarrhea and minor bruising, but otherwise has no complaints. Denies any missed doses. She has started on the 2nd month supply.   Her hepatitis B surface antigen is non-reactive, indicating no immunity. She would like to receive the hepatitis B vaccine today.    Plan: -Administered 1st of 2 hepatitis B vaccines. F/u appt scheduled in 1 month to administer 2nd dose.  -Check hepatitis C RNA today -Continue Mavyret to complete 8 weeks of treatment -Counseled on importance of adherence -F/u visit in 4 months with Tammy Sours to test  for cure   Pervis Hocking, PharmD PGY1 Pharmacy Resident 09/16/2020 11:43 AM

## 2020-09-18 LAB — HEPATITIS C RNA QUANTITATIVE
HCV Quantitative Log: <1.18 log IU/mL — ABNORMAL HIGH
HCV RNA, PCR, QN: <15 IU/mL — ABNORMAL HIGH

## 2020-10-03 ENCOUNTER — Other Ambulatory Visit (HOSPITAL_COMMUNITY): Payer: Self-pay

## 2020-10-06 ENCOUNTER — Other Ambulatory Visit (HOSPITAL_COMMUNITY): Payer: Self-pay

## 2020-10-19 ENCOUNTER — Other Ambulatory Visit: Payer: Self-pay

## 2020-10-19 ENCOUNTER — Ambulatory Visit (INDEPENDENT_AMBULATORY_CARE_PROVIDER_SITE_OTHER): Payer: Medicaid Other | Admitting: Pharmacist

## 2020-10-19 DIAGNOSIS — Z23 Encounter for immunization: Secondary | ICD-10-CM

## 2020-10-19 NOTE — Progress Notes (Signed)
   [facility] Vaccination Visit  HPI: Rebecca Weber is a 30 y.o. female who presents to the [facility] pharmacy clinic for vaccine administration.  Hepatitis B Lab Results  Component Value Date   HEPBSAB NON-REACTIVE 08/05/2020   Lab Results  Component Value Date   HEPBSAG NON-REACTIVE 08/05/2020    Hepatitis C No results found for: HCVAB  Hepatitis A No results found for: HAV  Assessment & Plan: - Administered Heplisav b vaccine #2/2 - Patient tolerated well - F/u with Rebecca Weber for SVR Hepatitis C visit on 10/12  Patient finished her Mavyret last week. Did not miss any doses.  Rebecca Weber L. Rebecca Weber, PharmD, BCIDP, AAHIVP, CPP Clinical Pharmacist Practitioner Infectious Diseases Clinical Pharmacist [facility] 10/19/2020, 2:18 PM

## 2020-10-24 ENCOUNTER — Other Ambulatory Visit (HOSPITAL_COMMUNITY): Payer: Self-pay

## 2020-12-14 ENCOUNTER — Ambulatory Visit: Payer: Medicaid Other | Admitting: Nurse Practitioner

## 2020-12-28 ENCOUNTER — Ambulatory Visit: Payer: Medicaid Other | Admitting: Nurse Practitioner

## 2021-01-16 ENCOUNTER — Ambulatory Visit: Payer: Medicaid Other | Admitting: Family

## 2021-01-18 ENCOUNTER — Encounter: Payer: Self-pay | Admitting: Family

## 2021-01-18 ENCOUNTER — Ambulatory Visit (INDEPENDENT_AMBULATORY_CARE_PROVIDER_SITE_OTHER): Payer: Medicaid Other | Admitting: Family

## 2021-01-18 ENCOUNTER — Other Ambulatory Visit: Payer: Self-pay

## 2021-01-18 VITALS — BP 117/75 | HR 92 | Temp 97.9°F | Wt 227.0 lb

## 2021-01-18 DIAGNOSIS — B182 Chronic viral hepatitis C: Secondary | ICD-10-CM

## 2021-01-18 NOTE — Progress Notes (Signed)
Subjective:    Patient ID: Rebecca Weber, female    DOB: December 14, 1990, 30 y.o.   MRN: [id number]  Chief Complaint  Patient presents with   Hepatitis C    HPI:  Rebecca Weber is a 30 y.o. female with chronic Hepatitis C last seen on 09/16/20 by [name], PharmD, CPP for end of treatment visit with good adherence and tolerance to her antiviral regimen of Mavyret. Hepatitis C RNA level was undetectable. Here today for cure/SVR12 visit.  Rebecca Weber has been doing well since her last office visit. Has had occasional nausea with no other symptoms and denies abdominal pain, vomiting, fatigue, scleral icterus and jaundice. No new concerns/complaints.    Allergies  Allergen Reactions   Sulfa Antibiotics Rash      Outpatient Medications Prior to Visit  Medication Sig Dispense Refill   Glecaprevir-Pibrentasvir (MAVYRET) 100-40 MG TABS Take 3 tablets by mouth daily. Take 3 tablets by mouth daily with a meal. (Patient not taking: Reported on 01/18/2021) 84 tablet 1   SHAROBEL 0.35 MG tablet Take 1 tablet by mouth daily.     No facility-administered medications prior to visit.     Past Medical History:  Diagnosis Date   ADHD    Depression    Medical history non-contributory      Past Surgical History:  Procedure Laterality Date   NO PAST SURGERIES      Review of Systems  Constitutional:  Negative for chills, fatigue, fever and unexpected weight change.  Respiratory:  Negative for cough, chest tightness, shortness of breath and wheezing.   Cardiovascular:  Negative for chest pain and leg swelling.  Gastrointestinal:  Negative for abdominal distention, constipation, diarrhea, nausea and vomiting.  Neurological:  Negative for dizziness, weakness, light-headedness and headaches.  Hematological:  Does not bruise/bleed easily.     Objective:    BP 117/75   Pulse 92   Temp 97.9 F (36.6 C) (Oral)   Wt 227 lb (103 kg)   LMP 12/18/2020 (Approximate)   SpO2 98%   BMI 36.64 kg/m   Nursing note and vital signs reviewed.  Physical Exam Constitutional:      General: She is not in acute distress.    Appearance: She is well-developed.  Cardiovascular:     Rate and Rhythm: Normal rate and regular rhythm.     Heart sounds: Normal heart sounds. No murmur heard.   No friction rub. No gallop.  Pulmonary:     Effort: Pulmonary effort is normal. No respiratory distress.     Breath sounds: Normal breath sounds. No wheezing or rales.  Chest:     Chest wall: No tenderness.  Abdominal:     General: Bowel sounds are normal. There is no distension.     Palpations: Abdomen is soft. There is no mass.     Tenderness: There is no abdominal tenderness. There is no guarding or rebound.  Skin:    General: Skin is warm and dry.  Neurological:     Mental Status: She is alert and oriented to person, place, and time.  Psychiatric:        Behavior: Behavior normal.        Thought Content: Thought content normal.        Judgment: Judgment normal.     Depression screen PHQ 2/9 02/01/2017  Decreased Interest 0  Down, Depressed, Hopeless 0  PHQ - 2 Score 0  Altered sleeping 1  Tired, decreased energy 1  Change in appetite 0  Feeling  bad or failure about yourself  0  Trouble concentrating 0  Moving slowly or fidgety/restless 0  Suicidal thoughts 0  PHQ-9 Score 2       Assessment & Plan:    Patient Active Problem List   Diagnosis Date Noted   Chronic hepatitis C without hepatic coma ([place]) 08/05/2020   Supervision of low-risk pregnancy 02/01/2017   Tobacco smoking affecting pregnancy 02/01/2017     Problem List Items Addressed This Visit       Digestive   Chronic hepatitis C without hepatic coma ([place]) - Primary    Rebecca Weber completed 8 weeks of Mavyret for Chronic Hepatitis C in June 2022 and here today for [facility] visit. Remains asymptomatic. Reviewed previous lab work and discussed plan of care to include checking Hepatitis C RNA level and CMET today. If undetectable no  further treatment is needed. No additional screening for Hepatocellular carcinoma is indicated with low risk for fibrosis. If screening is needed in the future will need to check Hepatitis C RNA level as Hepatitis C antibody test will always be positive. Plan for follow up as needed pending lab work results.       Relevant Orders   Comp Met (CMET)   Hepatitis C RNA quantitative     I have discontinued [employer] and [place].   Follow-up: Return if symptoms worsen or fail to improve.   Terri Piedra, MSN, FNP-C Nurse Practitioner [facility] [place] number: [phone]

## 2021-01-18 NOTE — Assessment & Plan Note (Signed)
Rebecca Weber completed 8 weeks of Mavyret for Chronic Hepatitis C in June 2022 and here today for [facility] visit. Remains asymptomatic. Reviewed previous lab work and discussed plan of care to include checking Hepatitis C RNA level and CMET today. If undetectable no further treatment is needed. No additional screening for Hepatocellular carcinoma is indicated with low risk for fibrosis. If screening is needed in the future will need to check Hepatitis C RNA level as Hepatitis C antibody test will always be positive. Plan for follow up as needed pending lab work results.

## 2021-01-18 NOTE — Patient Instructions (Signed)
Nice to see you.  We will check your lab work today.  If you viral load is negative (<15) this means you have been cured and no further treatment is needed.   If you need screening for Hepatitis C in the future the Antibody test will always be positive. Provider will need to check the Viral load or RNA level to determine if infection is present.   No need for further screenings with low risk of fibrosis.   Follow up as needed.   Have a great day and stay safe!

## 2021-01-20 LAB — COMPREHENSIVE METABOLIC PANEL
AG Ratio: 1.7 (calc) (ref 1.0–2.5)
ALT: 16 U/L (ref 6–29)
AST: 16 U/L (ref 10–30)
Albumin: 4 g/dL (ref 3.6–5.1)
Alkaline phosphatase (APISO): 108 U/L (ref 31–125)
BUN: 10 mg/dL (ref 7–25)
CO2: 27 mmol/L (ref 20–32)
Calcium: 9 mg/dL (ref 8.6–10.2)
Chloride: 105 mmol/L (ref 98–110)
Creat: 0.74 mg/dL (ref 0.50–0.97)
Globulin: 2.4 g/dL (calc) (ref 1.9–3.7)
Glucose, Bld: 86 mg/dL (ref 65–99)
Potassium: 4 mmol/L (ref 3.5–5.3)
Sodium: 139 mmol/L (ref 135–146)
Total Bilirubin: 0.2 mg/dL (ref 0.2–1.2)
Total Protein: 6.4 g/dL (ref 6.1–8.1)

## 2021-01-20 LAB — HEPATITIS C RNA QUANTITATIVE
HCV Quantitative Log: <1.18 log IU/mL
HCV RNA, PCR, QN: <15 IU/mL

## 2021-02-16 ENCOUNTER — Ambulatory Visit: Payer: Medicaid Other | Admitting: Family Medicine

## 2022-04-13 ENCOUNTER — Other Ambulatory Visit (HOSPITAL_COMMUNITY): Payer: Self-pay
